# Patient Record
Sex: Male | Born: 1965 | Race: Black or African American | Hispanic: No | Marital: Single | Smoking: Never smoker
Health system: Southern US, Community
[De-identification: ages and names within clinical notes are randomized; demographics above are authoritative.]

## PROBLEM LIST (undated history)

## (undated) DIAGNOSIS — J302 Other seasonal allergic rhinitis: Secondary | ICD-10-CM

## (undated) DIAGNOSIS — K859 Acute pancreatitis without necrosis or infection, unspecified: Secondary | ICD-10-CM

## (undated) DIAGNOSIS — E1110 Type 2 diabetes mellitus with ketoacidosis without coma: Secondary | ICD-10-CM

## (undated) HISTORY — PX: NO PAST SURGERIES: SHX2092

---

## 2012-03-26 ENCOUNTER — Emergency Department (HOSPITAL_COMMUNITY)
Admission: EM | Admit: 2012-03-26 | Discharge: 2012-03-27 | Disposition: A | Discharge status: Home / Self Care | Payer: Self-pay | Attending: Emergency Medicine | Admitting: Emergency Medicine

## 2012-03-26 DIAGNOSIS — Z591 Inadequate housing, unspecified: Secondary | ICD-10-CM | POA: Insufficient documentation

## 2012-03-26 DIAGNOSIS — F1010 Alcohol abuse, uncomplicated: Secondary | ICD-10-CM

## 2012-03-26 NOTE — ED Notes (Signed)
Pt was sent from by [employer] via [facility] due to intoxication. Pt is alert and orientated. Pt has unsteady gait.VSS

## 2012-03-26 NOTE — ED Provider Notes (Signed)
History     CSN: [id number]  Arrival date & time 03/26/12  2210   First MD Initiated Contact with Patient 03/26/12 2304      Chief Complaint  Patient presents with  . Alcohol Intoxication    (Consider location/radiation/quality/duration/timing/severity/associated sxs/prior treatment) HPI HX per PT, is homeless and has been staying at a shelter.  Tonight BIB EMS 2/2 alcohol intoxication.  PT denies any pain/ injury or trauma. No medications, denies any medical problems.  PT wishes to be discharged at this time and go back to the shelter. No N/V. No F/C or recent illness.   No past medical history on file.  No past surgical history on file.  No family history on file.  History  Substance Use Topics  . Smoking status: Not on file  . Smokeless tobacco: Not on file  . Alcohol Use: Not on file      Review of Systems  Constitutional: Negative for fever and chills.  HENT: Negative for neck pain and neck stiffness.   Eyes: Negative for pain.  Respiratory: Negative for shortness of breath.   Cardiovascular: Negative for chest pain.  Gastrointestinal: Negative for abdominal pain.  Genitourinary: Negative for dysuria.  Musculoskeletal: Negative for back pain.  Skin: Negative for rash.  Neurological: Negative for headaches.  All other systems reviewed and are negative.    Allergies  Review of patient's allergies indicates no known allergies.  Home Medications  No current outpatient prescriptions on file.  BP 119/76  Pulse 88  Temp 98.1 F (36.7 C) (Oral)  Resp 18  SpO2 100%  Physical Exam  Constitutional: He is oriented to person, place, and time. He appears well-developed and well-nourished.  HENT:  Head: Normocephalic and atraumatic.  Eyes: EOM are normal. Pupils are equal, round, and reactive to light.  Neck: Neck supple.  Cardiovascular: Normal rate, regular rhythm and intact distal pulses.   Pulmonary/Chest: Effort normal and breath sounds normal. No  respiratory distress.  Abdominal: Soft. Bowel sounds are normal. He exhibits no distension. There is no tenderness.  Musculoskeletal: Normal range of motion. He exhibits no edema and no tenderness.  Neurological: He is alert and oriented to person, place, and time. No cranial nerve deficit. Coordination normal.       Speech clear, conversant and appropriate  Skin: Skin is warm and dry.    ED Course  Procedures (including critical care time)  11:45 PM tolerates POs, ambulates absolutely no distress to the bathroom unassisted, requesting to be discharged, speech clear, cooperative and appropriate.  No indication for labs, observation or admit  MDM  Alcohol intoxication earlier today, now clinically sober/ appropriate, stable for discharge.   VS and nursing notes reviewed  No old records available to review       Sunnie Nielsen, MD 03/26/12 2348

## 2012-03-26 NOTE — ED Notes (Signed)
Bed:WA18<BR> Expected date:<BR> Expected time:<BR> Means of arrival:<BR> Comments:<BR> EMS

## 2012-10-17 ENCOUNTER — Encounter (HOSPITAL_BASED_OUTPATIENT_CLINIC_OR_DEPARTMENT_OTHER): Payer: Self-pay | Admitting: *Deleted

## 2012-10-17 ENCOUNTER — Inpatient Hospital Stay (HOSPITAL_BASED_OUTPATIENT_CLINIC_OR_DEPARTMENT_OTHER)
Admission: EM | Admit: 2012-10-17 | Discharge: 2012-10-20 | DRG: 637 | Disposition: A | Discharge status: Home / Self Care | Payer: Self-pay | Attending: Internal Medicine | Admitting: Internal Medicine

## 2012-10-17 ENCOUNTER — Inpatient Hospital Stay (HOSPITAL_BASED_OUTPATIENT_CLINIC_OR_DEPARTMENT_OTHER): Payer: Self-pay

## 2012-10-17 DIAGNOSIS — E860 Dehydration: Secondary | ICD-10-CM

## 2012-10-17 DIAGNOSIS — E119 Type 2 diabetes mellitus without complications: Secondary | ICD-10-CM

## 2012-10-17 DIAGNOSIS — K859 Acute pancreatitis without necrosis or infection, unspecified: Secondary | ICD-10-CM

## 2012-10-17 DIAGNOSIS — F1020 Alcohol dependence, uncomplicated: Secondary | ICD-10-CM | POA: Diagnosis present

## 2012-10-17 DIAGNOSIS — E46 Unspecified protein-calorie malnutrition: Secondary | ICD-10-CM | POA: Diagnosis present

## 2012-10-17 DIAGNOSIS — J301 Allergic rhinitis due to pollen: Secondary | ICD-10-CM | POA: Diagnosis present

## 2012-10-17 DIAGNOSIS — R112 Nausea with vomiting, unspecified: Secondary | ICD-10-CM

## 2012-10-17 DIAGNOSIS — F1021 Alcohol dependence, in remission: Secondary | ICD-10-CM

## 2012-10-17 DIAGNOSIS — F17200 Nicotine dependence, unspecified, uncomplicated: Secondary | ICD-10-CM | POA: Diagnosis present

## 2012-10-17 DIAGNOSIS — Z6830 Body mass index (BMI) 30.0-30.9, adult: Secondary | ICD-10-CM

## 2012-10-17 DIAGNOSIS — E1110 Type 2 diabetes mellitus with ketoacidosis without coma: Secondary | ICD-10-CM

## 2012-10-17 HISTORY — DX: Type 2 diabetes mellitus with ketoacidosis without coma: E11.10

## 2012-10-17 HISTORY — DX: Acute pancreatitis without necrosis or infection, unspecified: K85.90

## 2012-10-17 HISTORY — DX: Other seasonal allergic rhinitis: J30.2

## 2012-10-17 LAB — URINALYSIS, ROUTINE W REFLEX MICROSCOPIC
Bilirubin Urine: NEGATIVE
Hgb urine dipstick: NEGATIVE
Ketones, ur: >80 mg/dL — AB
Specific Gravity, Urine: 1.038 — ABNORMAL HIGH (ref 1.005–1.030)
Urobilinogen, UA: 0.2 mg/dL (ref 0.0–1.0)

## 2012-10-17 LAB — CBC
HCT: 41.9 % (ref 39.0–52.0)
HCT: 38.8 % — ABNORMAL LOW (ref 39.0–52.0)
Hemoglobin: 14.9 g/dL (ref 13.0–17.0)
Hemoglobin: 14.1 g/dL (ref 13.0–17.0)
MCH: 33 pg (ref 26.0–34.0)
MCV: 92.7 fL (ref 78.0–100.0)
RBC: 4.52 MIL/uL (ref 4.22–5.81)
RBC: 4.24 MIL/uL (ref 4.22–5.81)
WBC: 6.4 10*3/uL (ref 4.0–10.5)
WBC: 6.7 10*3/uL (ref 4.0–10.5)

## 2012-10-17 LAB — GLUCOSE, CAPILLARY
Glucose-Capillary: 143 mg/dL — ABNORMAL HIGH (ref 70–99)
Glucose-Capillary: 148 mg/dL — ABNORMAL HIGH (ref 70–99)
Glucose-Capillary: 193 mg/dL — ABNORMAL HIGH (ref 70–99)
Glucose-Capillary: 195 mg/dL — ABNORMAL HIGH (ref 70–99)

## 2012-10-17 LAB — BASIC METABOLIC PANEL
CO2: 18 mEq/L — ABNORMAL LOW (ref 19–32)
Calcium: 8.7 mg/dL (ref 8.4–10.5)
Chloride: 96 mEq/L (ref 96–112)
Creatinine, Ser: 0.7 mg/dL (ref 0.50–1.35)
GFR calc Af Amer: >90 mL/min (ref >90)
GFR calc Af Amer: >90 mL/min (ref >90)
GFR calc non Af Amer: >90 mL/min (ref >90)
Glucose, Bld: 148 mg/dL — ABNORMAL HIGH (ref 70–99)
Potassium: 4 mEq/L (ref 3.5–5.1)
Potassium: 3.6 mEq/L (ref 3.5–5.1)
Sodium: 134 mEq/L — ABNORMAL LOW (ref 135–145)
Sodium: 135 mEq/L (ref 135–145)

## 2012-10-17 LAB — COMPREHENSIVE METABOLIC PANEL
AST: 17 U/L (ref 0–37)
BUN: 10 mg/dL (ref 6–23)
CO2: 16 mEq/L — ABNORMAL LOW (ref 19–32)
Calcium: 10.3 mg/dL (ref 8.4–10.5)
Chloride: 91 mEq/L — ABNORMAL LOW (ref 96–112)
Creatinine, Ser: 1 mg/dL (ref 0.50–1.35)
GFR calc Af Amer: >90 mL/min (ref >90)
GFR calc non Af Amer: 90 mL/min — ABNORMAL LOW (ref >90)
Glucose, Bld: 300 mg/dL — ABNORMAL HIGH (ref 70–99)
Total Bilirubin: 0.5 mg/dL (ref 0.3–1.2)

## 2012-10-17 LAB — LIPASE, BLOOD: Lipase: 211 U/L — ABNORMAL HIGH (ref 11–59)

## 2012-10-17 LAB — ETHANOL: Alcohol, Ethyl (B): <11 mg/dL (ref 0–11)

## 2012-10-17 LAB — LIPID PANEL
HDL: 34 mg/dL — ABNORMAL LOW (ref >39)
LDL Cholesterol: 172 mg/dL — ABNORMAL HIGH (ref 0–99)
Total CHOL/HDL Ratio: 6.6 RATIO
Triglycerides: 90 mg/dL (ref <150)

## 2012-10-17 LAB — URINE MICROSCOPIC-ADD ON

## 2012-10-17 LAB — MRSA PCR SCREENING: MRSA by PCR: NEGATIVE

## 2012-10-17 LAB — HEMOGLOBIN A1C: Hgb A1c MFr Bld: 15.7 % — ABNORMAL HIGH (ref <5.7)

## 2012-10-17 MED ORDER — DEXTROSE 50 % IV SOLN: 25.0000 mL | INTRAVENOUS | Status: DC | PRN

## 2012-10-17 MED ORDER — SODIUM CHLORIDE 0.9 % IV SOLN
INTRAVENOUS | Status: DC
  Administered 2012-10-17: 2.5 [IU]/h via INTRAVENOUS
  Filled 2012-10-17 (×2): qty 1

## 2012-10-17 MED ORDER — ACETONE (URINE) TEST VI STRP
1.0000 | ORAL_STRIP | Status: DC | PRN
  Filled 2012-10-17: qty 1

## 2012-10-17 MED ORDER — ONDANSETRON HCL 4 MG/2ML IJ SOLN
4.0000 mg | Freq: Once | INTRAMUSCULAR | Status: AC
  Administered 2012-10-17: 4 mg via INTRAVENOUS
  Filled 2012-10-17: qty 2

## 2012-10-17 MED ORDER — SODIUM CHLORIDE 0.9 % IV SOLN: 1000.0000 mL | INTRAVENOUS | Status: DC

## 2012-10-17 MED ORDER — SODIUM CHLORIDE 0.9 % IV SOLN
INTRAVENOUS | Status: DC
  Administered 2012-10-17: 1.8 [IU]/h via INTRAVENOUS

## 2012-10-17 MED ORDER — SODIUM CHLORIDE 0.9 % IV SOLN
INTRAVENOUS | Status: AC
  Administered 2012-10-17: 19:00:00 via INTRAVENOUS

## 2012-10-17 MED ORDER — SODIUM CHLORIDE 0.9 % IV SOLN
INTRAVENOUS | Status: AC
  Filled 2012-10-17: qty 100

## 2012-10-17 MED ORDER — DEXTROSE-NACL 5-0.45 % IV SOLN: INTRAVENOUS | Status: DC

## 2012-10-17 MED ORDER — SODIUM CHLORIDE 0.9 % IV SOLN
1000.0000 mL | Freq: Once | INTRAVENOUS | Status: AC
  Administered 2012-10-17: 1000 mL via INTRAVENOUS

## 2012-10-17 MED ORDER — INSULIN REGULAR HUMAN 100 UNIT/ML IJ SOLN
INTRAMUSCULAR | Status: AC
  Filled 2012-10-17: qty 1

## 2012-10-17 MED ORDER — ENOXAPARIN SODIUM 40 MG/0.4ML ~~LOC~~ SOLN
40.0000 mg | SUBCUTANEOUS | Status: DC
  Administered 2012-10-17 – 2012-10-19 (×3): 40 mg via SUBCUTANEOUS
  Filled 2012-10-17 (×4): qty 0.4

## 2012-10-17 MED ORDER — SODIUM CHLORIDE 0.9 % IV SOLN: INTRAVENOUS | Status: DC

## 2012-10-17 MED ORDER — ONDANSETRON HCL 4 MG/2ML IJ SOLN
4.0000 mg | Freq: Four times a day (QID) | INTRAMUSCULAR | Status: DC | PRN
Start: 2012-10-17 — End: 2012-10-20

## 2012-10-17 MED ORDER — DEXTROSE-NACL 5-0.45 % IV SOLN
INTRAVENOUS | Status: DC
  Administered 2012-10-17: 16:00:00 via INTRAVENOUS

## 2012-10-17 NOTE — ED Notes (Signed)
Pt amb to room 5 with quick steady gait in nad, pt smiling and laughing, reports nausea and abd pain x Saturday.

## 2012-10-17 NOTE — ED Notes (Signed)
hospitalist has been paged again, to [phone]

## 2012-10-17 NOTE — ED Notes (Signed)
RN and MD walk to public computer station to get pt for exam. Pt amb to room smiling in nad.

## 2012-10-17 NOTE — ED Notes (Signed)
md at bedside performing bedside ultrasound, iv placement deferred.

## 2012-10-17 NOTE — ED Notes (Signed)
Insulin drip changed to 3.3 on the pump  With cbg of 226

## 2012-10-17 NOTE — Progress Notes (Signed)
   Mark Kirby, is a 47 y.o. male, DOB - May 04, 1965, UEA:[id number], who was in outpatient alcohol detox for the last 2 months presented [facility] with chief complaints of abdominal pain and the, workup there suggestive of acute pancreatitis along with metabolic acidosis caused by mild DKA, patient has no previous history of diabetes mellitus. He is hemodynamically stable on insulin drip accepted to [facility] stepped-down due to insulin drip.     Data Review   Micro Results No results found for this or any previous visit (from the past 240 hour(s)).  Radiology Reports No results found.  CBC  Recent Labs Lab 10/17/12 1320  WBC 6.4  HGB 14.9  HCT 41.9  PLT 195  MCV 92.7  MCH 33.0  MCHC 35.6  RDW 12.2    Chemistries   Recent Labs Lab 10/17/12 1320  NA 132*  K 4.3  CL 91*  CO2 16*  GLUCOSE 300*  BUN 10  CREATININE 1.00  CALCIUM 10.3  AST 17  ALT 16  ALKPHOS 78  BILITOT 0.5   ------------------------------------------------------------------------------------------------------------------ CrCl is unknown because there is no height on file for the current visit. ------------------------------------------------------------------------------------------------------------------ No results found for this basename: HGBA1C,  in the last 72 hours ------------------------------------------------------------------------------------------------------------------ No results found for this basename: CHOL, HDL, LDLCALC, TRIG, CHOLHDL, LDLDIRECT,  in the last 72 hours ------------------------------------------------------------------------------------------------------------------ No results found for this basename: TSH, T4TOTAL, FREET3, T3FREE, THYROIDAB,  in the last 72 hours ------------------------------------------------------------------------------------------------------------------ No results found for this basename: VITAMINB12, FOLATE, FERRITIN, TIBC,  IRON, RETICCTPCT,  in the last 72 hours  Coagulation profile No results found for this basename: INR, PROTIME,  in the last 168 hours  No results found for this basename: DDIMER,  in the last 72 hours  Cardiac Enzymes No results found for this basename: CK, CKMB, TROPONINI, MYOGLOBIN,  in the last 168 hours ------------------------------------------------------------------------------------------------------------------ No components found with this basename: POCBNP,

## 2012-10-17 NOTE — ED Notes (Signed)
Pt care transferred to [facility] staff. Pt is awake, alert and cooperative, denies any c/o.

## 2012-10-17 NOTE — ED Provider Notes (Signed)
  CSN: [id number]     Arrival date & time 10/17/12  1250 History     First MD Initiated Contact with Patient 10/17/12 1254     Chief Complaint  Patient presents with  . Abdominal Pain    HPI Patient reports nausea and vomiting over the past 48 hours.  He has a prior history of alcohol abuse.  He's been sober now for 75 days and currently is in a treatment facility.  He reports every time he eats or drinks she develops upper abdominal discomfort pain and vomiting.  No fevers or chills.  History of pancreatitis.  Patient is not a diabetic.  No diarrhea.  No significant abdominal discomfort at this time.  Symptoms are mild to moderate in severity.  Nothing worsens or improves his symptoms.   History reviewed. No pertinent past medical history. History reviewed. No pertinent past surgical history. History reviewed. No pertinent family history. History  Substance Use Topics  . Smoking status: Never Smoker   . Smokeless tobacco: Not on file  . Alcohol Use: No     Comment: hx     Review of Systems  All other systems reviewed and are negative.    Allergies  Review of patient's allergies indicates no known allergies.  Home Medications  No current outpatient prescriptions on file. There were no vitals taken for this visit. Physical Exam  Nursing note and vitals reviewed. Constitutional: He is oriented to person, place, and time. He appears well-developed and well-nourished.  HENT:  Head: Normocephalic and atraumatic.  Eyes: EOM are normal.  Neck: Normal range of motion.  Cardiovascular: Normal rate, regular rhythm, normal heart sounds and intact distal pulses.   Pulmonary/Chest: Effort normal and breath sounds normal. No respiratory distress.  Abdominal: Soft. He exhibits no distension.  Mild epigastric tenderness without guarding or rebound  Genitourinary: Rectum normal.  Musculoskeletal: Normal range of motion.  Neurological: He is alert and oriented to person, place, and time.   Skin: Skin is warm and dry.  Psychiatric: He has a normal mood and affect. Judgment normal.    ED Course   Procedures (including critical care time)  Labs Reviewed  COMPREHENSIVE METABOLIC PANEL - Abnormal; Notable for the following:    Sodium 132 (*)    Chloride 91 (*)    CO2 16 (*)    Glucose, Bld 300 (*)    GFR calc non Af Amer 90 (*)    All other components within normal limits  LIPASE, BLOOD - Abnormal; Notable for the following:    Lipase 211 (*)    All other components within normal limits  CBC  HEMOGLOBIN A1C  URINALYSIS, ROUTINE W REFLEX MICROSCOPIC   No results found. 1. Pancreatitis   2. DKA (diabetic ketoacidoses)     MDM  Lipase is elevated to 11.  This likely represents pancreatitis.  Patient's fasting blood sugars 300 and his bicarbonate is 16.  This calculates out 10 anion gap 25.  This likely represents developing diabetic ketoacidosis which may also be the cause of his nausea and vomiting.  Vital signs are normal.  Facial be started on insulin at this time.  Hemoglobin A1c has been added.  Patient will require admission to the hospital  Lyanne Co, MD 10/17/12 1447

## 2012-10-17 NOTE — ED Notes (Signed)
md at bedside discussing plan of care with pt. Pt denies any c/o at this time.

## 2012-10-17 NOTE — ED Notes (Signed)
Called for pt x1.  Found pt down at the computer station.

## 2012-10-17 NOTE — H&P (Signed)
History and Physical       Hospital Admission Note Date: 10/17/2012  Patient name: Mark Kirby Medical record number: [id number] Date of birth: 24-Jun-1965 Age: 47 y.o. Gender: male PCP: Provider Not In System    Chief Complaint:  nausea and vomiting with epigastric abdominal pain for last 2 days  HPI: Patient is a 47 year old male with no past medical history except alcohol abuse who has been in the [facility] alcohol detox since may 27th, has been sober since then was [facility] ED for nausea vomiting and abdominal pain for last 2 days. Patient reports that every time he ate or drank he would followup epigastric pain radiating to the back and started having vomiting. He denied any prior history of pancreatitis. Patient denied any prior history of diabetes. He did notice that he was drinking a lot of water in the last 1 month as well as increased urination but he did not thought it something out of the ordinary. He denies any family history of diabetes. In the ER patient was noted to have severe DKA with a anion gap of 25, bicarb 16, UA positive for ketones. Lipase was elevated at 211. Patient was transferred to [facility] for further workup.  Review of Systems:  Constitutional: Denies fever, chills, diaphoresis, + poor appetite and fatigue.  HEENT: Denies photophobia, eye pain, redness, hearing loss, ear pain, congestion, sore throat, rhinorrhea, sneezing, mouth sores, trouble swallowing, neck pain, neck stiffness and tinnitus.   Respiratory: Denies SOB, DOE, cough, chest tightness,  and wheezing.   Cardiovascular: Denies chest pain, palpitations and leg swelling.  Gastrointestinal: Please see history of present illness  Genitourinary: Denies dysuria, urgency, hematuria, flank pain and difficulty urinating. + frequency/ polyuria with polydipsia Musculoskeletal: Denies myalgias, back pain, joint swelling,  arthralgias and gait problem.  Skin: Denies pallor, rash and wound.  Neurological: Denies dizziness, seizures, syncope, weakness, light-headedness, numbness and headaches.  Hematological: Denies adenopathy. Easy bruising, personal or family bleeding history  Psychiatric/Behavioral: Denies suicidal ideation, mood changes, confusion, nervousness, sleep disturbance and agitation  Past Medical History: Past Medical History  Diagnosis Date  . Pancreatitis   . Seasonal allergies   . DKA (diabetic ketoacidoses) 10/17/2012   Past Surgical History  Procedure Laterality Date  . No past surgeries      Medications: Prior to Admission medications   Not on File    Allergies:  No Known Allergies  Social History:  reports that he has been smoking.  He does not have any smokeless tobacco history on file. He reports that  drinks alcohol. He reports that he does not use illicit drugs. patient reports that prior to going to the [facility] facility he used to drink upto 1/2 gallon of vodka in the last 5 years but he has been completely sober since may 27th.  Family History: History reviewed. No pertinent family history.  Physical Exam: Blood pressure 129/70, pulse 80, temperature 98 F (36.7 C), temperature source Oral, resp. rate 14, height 5\' 7"  (1.702 m), weight 85.911 kg (189 lb 6.4 oz), SpO2 100.00%. General: Alert, awake, oriented x3, in no acute distress. HEENT: normocephalic, atraumatic, anicteric sclera, pink conjunctiva, pupils equal and reactive to light and accomodation, oropharynx clear, dry mucosal membranes  Neck: supple, no masses or lymphadenopathy, no goiter, no bruits  Heart: Regular rate and rhythm, without murmurs, rubs or gallops. Lungs: Clear to auscultation bilaterally, no wheezing, rales or rhonchi. Abdomen: Soft, very mild tenderness in the epigastric region,  nondistended, positive bowel sounds, no masses. Extremities: No clubbing, cyanosis or edema with positive pedal  pulses. Neuro: Grossly intact, no focal neurological deficits, strength 5/5 upper and lower extremities bilaterally Psych: alert and oriented x 3, normal mood and affect Skin: no rashes or lesions, warm and dry   LABS on Admission:  Basic Metabolic Panel:  Recent Labs Lab 10/17/12 1320  NA 132*  K 4.3  CL 91*  CO2 16*  GLUCOSE 300*  BUN 10  CREATININE 1.00  CALCIUM 10.3   Liver Function Tests:  Recent Labs Lab 10/17/12 1320  AST 17  ALT 16  ALKPHOS 78  BILITOT 0.5  PROT 7.5  ALBUMIN 3.9    Recent Labs Lab 10/17/12 1320  LIPASE 211*   No results found for this basename: AMMONIA,  in the last 168 hours CBC:  Recent Labs Lab 10/17/12 1320  WBC 6.4  HGB 14.9  HCT 41.9  MCV 92.7  PLT 195   Cardiac Enzymes: No results found for this basename: CKTOTAL, CKMB, CKMBINDEX, TROPONINI,  in the last 168 hours BNP: No components found with this basename: POCBNP,  CBG:  Recent Labs Lab 10/17/12 1646 10/17/12 1800  GLUCAP 226* 193*     Radiological Exams on Admission: Dg Chest 2 View  10/17/2012   *RADIOLOGY REPORT*  Clinical Data: 47 year old male with shortness of breath  CHEST - 2 VIEW  Comparison: None  Findings: The cardiomediastinal silhouette is unremarkable. The lungs are clear. There is no evidence of focal airspace disease, pulmonary edema, suspicious pulmonary nodule/mass, pleural effusion, or pneumothorax. No acute bony abnormalities are identified.  IMPRESSION: No evidence of active cardiopulmonary disease.   Original Report Authenticated By: Harmon Pier, M.D.    Assessment/Plan Principal Problem:   DKA (diabetic ketoacidoses) with new onset diabetes mellitus - Will continue DKA protocol, insulin drip, aggressive IV fluid hydration.  - Once gap is closed, HCO3 >20, BG <180 consistently and patient is improving overall, will transitioned to subcutaneous insulin and clear liquid - He will need aggressive diabetic teaching, diabetic coordinator  consultation. Obtain hemoglobin A1c.  Active Problems:   Dehydration: Continue aggressive IV fluid hydration    Pancreatitis, acute: Patient reports that he has been sober since may 27th. Lipase is 211 with normal LFTs - Will obtain abdominal ultrasound for further workup, lipid panel to rule out hypertriglyceridemia     Nausea with vomiting - Placed on Zofran as needed  DVT prophylaxis: Lovenox  CODE STATUS: Full CODE STATUS  Further plan will depend as patient's clinical course evolves and further radiologic and laboratory data become available.   Time Spent on Admission: 1 hour  RAI,RIPUDEEP M.D. [facility] 10/17/2012, 6:31 PM Pager: [phone]  If 7PM-7AM, please contact night-coverage [email or web address] Password [email or web address]

## 2012-10-17 NOTE — ED Notes (Signed)
Report to Charlyne Quale RN, via phone. ETA 15 - .

## 2012-10-17 NOTE — ED Notes (Signed)
Hospitalist has been repaged 

## 2012-10-17 NOTE — ED Notes (Signed)
CBG 239.  Amy Burns RN notified.

## 2012-10-17 NOTE — ED Notes (Signed)
[facility] has been notified of 2C18C room at [facility]

## 2012-10-18 ENCOUNTER — Inpatient Hospital Stay (HOSPITAL_COMMUNITY): Payer: Self-pay

## 2012-10-18 DIAGNOSIS — F1020 Alcohol dependence, uncomplicated: Secondary | ICD-10-CM

## 2012-10-18 DIAGNOSIS — E119 Type 2 diabetes mellitus without complications: Secondary | ICD-10-CM

## 2012-10-18 LAB — BASIC METABOLIC PANEL
BUN: 9 mg/dL (ref 6–23)
BUN: 9 mg/dL (ref 6–23)
Calcium: 9 mg/dL (ref 8.4–10.5)
Calcium: 9.2 mg/dL (ref 8.4–10.5)
Chloride: 102 mEq/L (ref 96–112)
Chloride: 101 mEq/L (ref 96–112)
Creatinine, Ser: 0.73 mg/dL (ref 0.50–1.35)
GFR calc Af Amer: >90 mL/min (ref >90)
GFR calc Af Amer: >90 mL/min (ref >90)
GFR calc Af Amer: >90 mL/min (ref >90)
GFR calc Af Amer: >90 mL/min (ref >90)
GFR calc non Af Amer: >90 mL/min (ref >90)
GFR calc non Af Amer: >90 mL/min (ref >90)
Glucose, Bld: 222 mg/dL — ABNORMAL HIGH (ref 70–99)
Potassium: 3.6 mEq/L (ref 3.5–5.1)
Potassium: 3.6 mEq/L (ref 3.5–5.1)
Sodium: 135 mEq/L (ref 135–145)
Sodium: 133 mEq/L — ABNORMAL LOW (ref 135–145)
Sodium: 132 mEq/L — ABNORMAL LOW (ref 135–145)

## 2012-10-18 LAB — GLUCOSE, CAPILLARY
Glucose-Capillary: 129 mg/dL — ABNORMAL HIGH (ref 70–99)
Glucose-Capillary: 202 mg/dL — ABNORMAL HIGH (ref 70–99)
Glucose-Capillary: 205 mg/dL — ABNORMAL HIGH (ref 70–99)
Glucose-Capillary: 247 mg/dL — ABNORMAL HIGH (ref 70–99)

## 2012-10-18 LAB — LIPASE, BLOOD: Lipase: 94 U/L — ABNORMAL HIGH (ref 11–59)

## 2012-10-18 LAB — HEMOGLOBIN A1C: Mean Plasma Glucose: 404 mg/dL — ABNORMAL HIGH (ref <117)

## 2012-10-18 MED ORDER — INSULIN ASPART 100 UNIT/ML ~~LOC~~ SOLN
0.0000 [IU] | Freq: Every day | SUBCUTANEOUS | Status: DC
  Administered 2012-10-18: 2 [IU] via SUBCUTANEOUS

## 2012-10-18 MED ORDER — INSULIN ASPART 100 UNIT/ML ~~LOC~~ SOLN
0.0000 [IU] | Freq: Three times a day (TID) | SUBCUTANEOUS | Status: DC
  Administered 2012-10-18 (×2): 5 [IU] via SUBCUTANEOUS

## 2012-10-18 MED ORDER — INSULIN ASPART PROT & ASPART (70-30 MIX) 100 UNIT/ML ~~LOC~~ SUSP
15.0000 [IU] | Freq: Two times a day (BID) | SUBCUTANEOUS | Status: DC
  Administered 2012-10-18: 15 [IU] via SUBCUTANEOUS
  Filled 2012-10-18: qty 10

## 2012-10-18 MED ORDER — INSULIN ASPART 100 UNIT/ML ~~LOC~~ SOLN
0.0000 [IU] | Freq: Three times a day (TID) | SUBCUTANEOUS | Status: DC
  Administered 2012-10-18: 3 [IU] via SUBCUTANEOUS
  Administered 2012-10-19: 2 [IU] via SUBCUTANEOUS
  Administered 2012-10-19 – 2012-10-20 (×3): 3 [IU] via SUBCUTANEOUS
  Administered 2012-10-20: 2 [IU] via SUBCUTANEOUS

## 2012-10-18 MED ORDER — INSULIN GLARGINE 100 UNIT/ML ~~LOC~~ SOLN
10.0000 [IU] | Freq: Every day | SUBCUTANEOUS | Status: DC
  Administered 2012-10-18: 10 [IU] via SUBCUTANEOUS
  Filled 2012-10-18: qty 0.1

## 2012-10-18 MED ORDER — BD GETTING STARTED TAKE HOME KIT: 3/10ML X 30G SYRINGES
1.0000 | Freq: Once | Status: AC
  Administered 2012-10-18: 1
  Filled 2012-10-18: qty 1

## 2012-10-18 MED ORDER — LIVING WELL WITH DIABETES BOOK
Freq: Once | Status: AC
  Administered 2012-10-18: 17:00:00
  Filled 2012-10-18: qty 1

## 2012-10-18 NOTE — Progress Notes (Signed)
Inpatient Diabetes Program Recommendations  AACE/ADA: New Consensus Statement on Inpatient Glycemic Control (2013)  Target Ranges:  Prepandial:   less than 140 mg/dL      Peak postprandial:   less than 180 mg/dL (1-2 hours)      Critically ill patients:  140 - 180 mg/dL   Reason for Visit: Consult for new-onset diabetes   Note:  Patient admitted in DKA with new-onset diabetes.  Note Anion GAP of 16 based on 0800 lab.  (At midnight GAP was 11 and at 0400 GAP was 14.)  Note plan to recheck BMET.  Receptive to my visit.  States not surprised at diagnosis given elevated glucose when he had some dental work done as noted by NP.  Instructed patient regarding accessing the [facility] Education Network so he can begin watching diabetes videos.  Note dietitian consult.    Note patient has no insurance at present.  Gave him information regarding purchasing of a generic glucose meter at [facility] (or any other large chain pharmacy).  Gave him information regarding generic insulins available at [facility] for $24.88 a vial  (Novolin/ReliOn Human Insulin N, R, or 70/30).    Note Case Management referral-- patient has no PCP.  Note Dietitian consult.   Nursing Staff to instruct patient regarding basic, survival skill diabetes education including CBG testing and insulin preparation & administration.  Patient expresses confidence that he can learn and ready to start instruction.  Note patient is currently on Lantus 10 units daily.  Lantus insulin will likely be cost prohibitive for him.  Based on weight, patient would likely need a higher dose.  Would recommend patient be changed to 70/30 insulin 15 units BID with breakfast and supper (0.2 units/kg) with upward titration as indicated. Needs continued B-MET monitoring until [facility] remains closed.     Thank you for the consult.  Aztlan Coll S. Elsie Lincoln, RN, CNS, CDE Inpatient Diabetes Program, team pager [phone]

## 2012-10-18 NOTE — Plan of Care (Signed)
Problem: Food- and Nutrition-Related Knowledge Deficit (NB-1.1) Goal: Nutrition education Formal process to instruct or train a patient/client in a skill or to impart knowledge to help patients/clients voluntarily manage or modify food choices and eating behavior to maintain or improve health. Outcome: Completed/Met Date Met:  10/18/12  RD consulted for nutrition education regarding diabetes.     Lab Results  Component Value Date    HGBA1C 15.7* 10/17/2012    RD provided "Carbohydrate Counting for People with Diabetes" handout from the Academy of Nutrition and Dietetics. Discussed different food groups and their effects on blood sugar, emphasizing carbohydrate-containing foods. Provided list of carbohydrates and recommended serving sizes of common foods.  Discussed importance of controlled and consistent carbohydrate intake throughout the day. Provided examples of ways to balance meals/snacks and encouraged intake of high-fiber, whole grain complex carbohydrates. Teach back method used.  Expect good compliance.  Body mass index is 29.66 kg/(m^2). Pt meets criteria for Overweight based on current BMI.  Current diet order is Carbohydrate Modified Medium Calorie.  Reports a good appetite.  Labs and medications reviewed.   No further nutrition interventions warranted at this time. If additional nutrition issues arise, please re-consult RD.  Maureen Chatters, RD, LDN Pager #: [phone] After-Hours Pager #: [phone]

## 2012-10-18 NOTE — Progress Notes (Signed)
Utilization Review Completed.  

## 2012-10-18 NOTE — Progress Notes (Signed)
[facility] Progress Note [place] TEAM 1 - Stepdown ICU Team   Mark Kirby YNW:[id number] DOB: Oct 26, 1965 DOA: 10/17/2012 PCP: Provider Not In System  Brief narrative: 47 year old male patient with unremarkable past medical history except for alcohol abuse. [facility] has [facility] and has been sober since that time. He was sent to med [facility] ED for complaints of nausea vomiting and abdominal pain for 2 days. The patient reported every time he attempts to eat or drink he would develop epigastric pain radiating to his back and began to vomit. He denied any formal history of prior pancreatitis. Has never been diagnosed with diabetes. Did notice he had been drinking quite a bit of water over the past 30 days with increased radiation. Upon presentation to the emergency department the patient's serum glucose was greater than or 100 with an anion gap of 25. Lipase was also mildly elevated at 211. He was subsequently transferred to [facility] for further workup and likely admission.  Assessment/Plan: Active Problems:   DKA (diabetic ketoacidoses)/Diabetes mellitus, new onset -pt overnight was transitioned to long acting insulin but noted still had elevated AG -will continue LA insulin (70/30 with sensitive SSI) -repeat BMET today and in am -begin diabetes education including instruction in insulin administration use of glucometer, nutrition education -diabetes coordinator following -since ? mild chronic pancreatitis will check C-peptide IgG re: pancreatic function -HgbA1c elevated at 15.7- [t recalls Dec 2013 prior to dental procedure was told suger high and was instructed to follow up with MD but never did    Dehydration -BP stable and tol clears so decrease IVF rate    Pancreatitis, acute -no formal dx in past but pt able to recall prior LUQ pain and associated GI sx's (N/V) -Lipase rapidly decreased to 90 so doubt  true pancreatitis but rather elevation due to persistent N/V -abd US unremarkable/pancreas poorly visulaized    Nausea with vomiting -due to DKA/resolved    Alcoholism in recovery -pt has plans to return to [facility] then transition to a longer term program in [place]   DVT prophylaxis: Lovenox Code Status: Full Family Communication: Patient Disposition Plan/Expected LOS: Remain in step down since anion gap previously elevated-consider discharge in next 24 hours if remains stable Isolation: None Nutritional Status: Acute protein calorie malnutrition related to new diagnosis of diabetes  Consultants: None  Procedures: None  Antibiotics: None  HPI/Subjective: Patient alert and states feels much better than previous- no further nausea vomiting or abdominal pain reported. Eager to begin diabetes education.  Objective: Blood pressure 119/79, pulse 82, temperature 98.5 F (36.9 C), temperature source Oral, resp. rate 20, height 5\' 7"  (1.702 m), weight 85.911 kg (189 lb 6.4 oz), SpO2 98.00%.  Intake/Output Summary (Last 24 hours) at 10/18/12 1327 Last data filed at 10/18/12 0700  Gross per 24 hour  Intake  23.67 ml  Output    600 ml  Net -576.33 ml     Exam: General: No acute respiratory distress Lungs: Clear to auscultation bilaterally without wheezes or crackles, RA Cardiovascular: Regular rate and rhythm without murmur gallop or rub normal S1 and S2, no peripheral edema or JVD Abdomen: Nontender, nondistended, soft, bowel sounds positive, no rebound, no ascites, no appreciable mass Musculoskeletal: No significant cyanosis, clubbing of bilateral lower extremities Neurological: Alert and oriented x 3, moves all extremities x 4 without focal neurological deficits, CN 2-12 intact  Scheduled Meds:  Scheduled Meds: . enoxaparin (  LOVENOX) injection  40 mg Subcutaneous Q24H  . insulin aspart  0-15 Units Subcutaneous TID WC  . insulin glargine  10 Units Subcutaneous Daily    Continuous Infusions: . sodium chloride      **Reviewed in detail by the Attending Physicia  Data Reviewed: Basic Metabolic Panel:  Recent Labs Lab 10/17/12 2130 10/17/12 2314 10/18/12 0048 10/18/12 0400 10/18/12 0824  NA 134* 135 135 133* 132*  K 3.3* 3.6 3.6 3.7 3.6  CL 98 102 102 100 98  CO2 18* 21 22 19  18*  GLUCOSE 185* 148* 126* 222* 254*  BUN 9 9 9 9 9   CREATININE 0.72 0.71 0.73 0.76 0.83  CALCIUM 8.7 8.8 8.8 9.0 9.3   Liver Function Tests:  Recent Labs Lab 10/17/12 1320  AST 17  ALT 16  ALKPHOS 78  BILITOT 0.5  PROT 7.5  ALBUMIN 3.9    Recent Labs Lab 10/17/12 1320 10/18/12 0400  LIPASE 211* 94*   No results found for this basename: AMMONIA,  in the last 168 hours CBC:  Recent Labs Lab 10/17/12 1320 10/17/12 1857  WBC 6.4 6.7  HGB 14.9 14.1  HCT 41.9 38.8*  MCV 92.7 91.5  PLT 195 200   Cardiac Enzymes: No results found for this basename: CKTOTAL, CKMB, CKMBINDEX, TROPONINI,  in the last 168 hours BNP (last 3 results) No results found for this basename: PROBNP,  in the last 8760 hours CBG:  Recent Labs Lab 10/18/12 0148 10/18/12 0255 10/18/12 0413 10/18/12 0819 10/18/12 1135  GLUCAP 125* 129* 146* 230* 202*    Recent Results (from the past 240 hour(s))  MRSA PCR SCREENING     Status: None   Collection Time    10/17/12  6:04 PM      Result Value Range Status   MRSA by PCR NEGATIVE  NEGATIVE Final   Comment:            The GeneXpert MRSA Assay (FDA     approved for NASAL specimens     only), is one component of a     comprehensive MRSA colonization     surveillance program. It is not     intended to diagnose MRSA     infection nor to guide or     monitor treatment for     MRSA infections.     Studies:  Recent x-ray studies have been reviewed in detail by the Attending Physician    Junious Silk, ANP [facility] Office  [phone] Pager [phone]  **If unable to reach the above provider after  paging please contact the Flow Manager @ [phone]  On-Call/Text Page:      [email or web address]      password [email or web address]  If 7PM-7AM, please contact night-coverage [email or web address] Password [email or web address] 10/18/2012, 1:27 PM   LOS: 1 day   I have examined the patient, reviewed the chart and modified the above note which I agree with.   Yani Lal,MD [phone] 10/18/2012, 6:20 PM

## 2012-10-18 NOTE — Progress Notes (Signed)
Pt explained and taught how to take fingerstick and administer insulin. Pt performed his in fingerstick, drew up on insulin in syringe, and administered 5 units of insulin in his abdominal area. Will continue to keep educating patient. Adria Dill RN

## 2012-10-19 LAB — BASIC METABOLIC PANEL
BUN: 9 mg/dL (ref 6–23)
BUN: 9 mg/dL (ref 6–23)
CO2: 20 mEq/L (ref 19–32)
Calcium: 9.5 mg/dL (ref 8.4–10.5)
Chloride: 102 mEq/L (ref 96–112)
Creatinine, Ser: 0.68 mg/dL (ref 0.50–1.35)
Creatinine, Ser: 0.7 mg/dL (ref 0.50–1.35)
GFR calc Af Amer: >90 mL/min (ref >90)
GFR calc non Af Amer: >90 mL/min (ref >90)
Glucose, Bld: 228 mg/dL — ABNORMAL HIGH (ref 70–99)
Potassium: 3 mEq/L — ABNORMAL LOW (ref 3.5–5.1)

## 2012-10-19 MED ORDER — INSULIN ASPART PROT & ASPART (70-30 MIX) 100 UNIT/ML ~~LOC~~ SUSP
20.0000 [IU] | Freq: Two times a day (BID) | SUBCUTANEOUS | Status: DC
  Administered 2012-10-19 – 2012-10-20 (×3): 20 [IU] via SUBCUTANEOUS
  Filled 2012-10-19: qty 10

## 2012-10-19 MED ORDER — INSULIN NPH ISOPHANE & REGULAR (70-30) 100 UNIT/ML ~~LOC~~ SUSP: 20.0000 [IU] | Freq: Two times a day (BID) | SUBCUTANEOUS | Status: DC

## 2012-10-19 MED ORDER — POTASSIUM CHLORIDE CRYS ER 20 MEQ PO TBCR
40.0000 meq | EXTENDED_RELEASE_TABLET | Freq: Once | ORAL | Status: AC
  Administered 2012-10-19: 40 meq via ORAL
  Filled 2012-10-19: qty 2

## 2012-10-19 MED ORDER — INSULIN SYRINGES (DISPOSABLE) U-100 1 ML MISC: Status: AC

## 2012-10-19 MED ORDER — GLUCOSE BLOOD VI STRP: ORAL_STRIP | Status: AC

## 2012-10-19 MED ORDER — SODIUM CHLORIDE 0.9 % IV SOLN
1000.0000 mL | INTRAVENOUS | Status: DC
  Administered 2012-10-19: 1000 mL via INTRAVENOUS

## 2012-10-19 NOTE — Progress Notes (Signed)
[facility] Progress Note  TEAM 1 - Stepdown ICU Team   Mark Kirby RUE:[id number] DOB: 1965-10-09 DOA: 10/17/2012 PCP: Provider Not In System  Brief narrative: 47 year old male patient with unremarkable past medical history except for alcohol abuse. He has recently been at the [facility] alcohol detox since May 27 and has been sober since that time. He was sent to [facility] ED for complaints of nausea vomiting and abdominal pain for 2 days. The patient reported every time he attempts to eat or drink he would develop epigastric pain radiating to his back and began to vomit. He denied any formal history of prior pancreatitis. Has never been diagnosed with diabetes. Did notice he had been drinking quite a bit of water over the past 30 days with increased urination. Upon presentation to the emergency department the patient's serum glucose was markedly elevated with an anion gap of 25. Lipase was also elevated at 211. He was subsequently transferred to [facility] for further workup and likely admission.  Assessment/Plan:    DKA (diabetic ketoacidoses)/Diabetes mellitus, new onset -pt was transitioned to long acting insulin - over past 24 hours AG trend remains down- AG 8/20 was 14 so still at risk for recurrent DKA so continue to follow BMET closely -will continue LA insulin (70/30 with sensitive SSI) - repeat BMET today and in am -begin diabetes education including instruction in insulin administration use of glucometer, nutrition education -diabetes coordinator following -HgbA1c elevated at 15.7- Pt recalls Dec 2013 prior to dental procedure was told suger high and was instructed to follow up with MD but never did -CM assisting pt to locate a PCP    Dehydration -BP stable and tol clears so decrease IVF rate    Pancreatitis, acute -no formal dx in past but pt able to recall prior LUQ pain and associated GI sx's (N/V) -Lipase rapidly decreased to  90 -abd US unremarkable/pancreas poorly visulaized    Nausea with vomiting -due to DKA/resolved    Alcoholism in recovery -pt has plans to return to [facility] then transition to a longer term program in [employer] screening -FLP abnormal but this in setting of recent mild pancreatitis so recommend repeat in 6 weeks   DVT prophylaxis: Lovenox Code Status: Full Family Communication: no family present at time of exam  Disposition Plan/Expected LOS: Remain in step down since anion gap previously elevated-consider discharge in next 24 hours if remains stable  Consultants: None  Procedures: None  Antibiotics: None  HPI/Subjective: Patient alert and verbalizes understanding as to why not ready for dc just yet. No complaints.  Objective: Blood pressure 99/62, pulse 70, temperature 98.2 F (36.8 C), temperature source Oral, resp. rate 10, height 5\' 7"  (1.702 m), weight 88.678 kg (195 lb 8 oz), SpO2 99.00%. No intake or output data in the 24 hours ending 10/19/12 1132  Exam: General: No acute respiratory distress Lungs: Clear to auscultation bilaterally without wheezes or crackles, RA Cardiovascular: Regular rate and rhythm without murmur gallop or rub normal S1 and S2, no peripheral edema Abdomen: Nontender, nondistended, soft, bowel sounds positive, no rebound, no ascites, no appreciable mass Musculoskeletal: No significant cyanosis, clubbing of bilateral lower extremities Neurological: Alert and oriented x 3, moves all extremities x 4 without focal neurological deficits, CN 2-12 intact  Scheduled Meds:  Scheduled Meds: . enoxaparin (LOVENOX) injection  40 mg Subcutaneous Q24H  . insulin aspart  0-5 Units Subcutaneous QHS  . insulin aspart  0-9 Units Subcutaneous  TID WC  . insulin aspart protamine- aspart  20 Units Subcutaneous BID WC   Data Reviewed: Basic Metabolic Panel:  Recent Labs Lab 10/18/12 0048 10/18/12 0400 10/18/12 0824 10/18/12 1529  10/19/12 0500  NA 135 133* 132* 133* 136  K 3.6 3.7 3.6 3.9 3.0*  CL 102 100 98 101 102  CO2 22 19 18* 22 20  GLUCOSE 126* 222* 254* 244* 216*  BUN 9 9 9 8 9   CREATININE 0.73 0.76 0.83 0.73 0.68  CALCIUM 8.8 9.0 9.3 9.2 9.0   Liver Function Tests:  Recent Labs Lab 10/17/12 1320  AST 17  ALT 16  ALKPHOS 78  BILITOT 0.5  PROT 7.5  ALBUMIN 3.9    Recent Labs Lab 10/17/12 1320 10/18/12 0400  LIPASE 211* 94*   CBC:  Recent Labs Lab 10/17/12 1320 10/17/12 1857  WBC 6.4 6.7  HGB 14.9 14.1  HCT 41.9 38.8*  MCV 92.7 91.5  PLT 195 200   CBG:  Recent Labs Lab 10/18/12 0413 10/18/12 0819 10/18/12 1135 10/18/12 1752 10/18/12 2038  GLUCAP 146* 230* 202* 205* 247*    Recent Results (from the past 240 hour(s))  MRSA PCR SCREENING     Status: None   Collection Time    10/17/12  6:04 PM      Result Value Range Status   MRSA by PCR NEGATIVE  NEGATIVE Final   Comment:            The GeneXpert MRSA Assay (FDA     approved for NASAL specimens     only), is one component of a     comprehensive MRSA colonization     surveillance program. It is not     intended to diagnose MRSA     infection nor to guide or     monitor treatment for     MRSA infections.     Studies:  Recent x-ray studies have been reviewed in detail by the Attending Physician  Junious Silk, ANP [facility] Office  [phone] Pager [phone]  **If unable to reach the above provider after paging please contact the Flow Manager @ [phone]  On-Call/Text Page:      [email or web address]      password [email or web address]  If 7PM-7AM, please contact night-coverage [email or web address] Password [email or web address] 10/19/2012, 11:32 AM   LOS: 2 days   I have personally examined this patient and reviewed the entire database. I have reviewed the above note, made any necessary editorial changes, and agree with its content.  Lonia Blood, MD [facility]

## 2012-10-19 NOTE — Progress Notes (Signed)
Morning insulin drawn and administered by pt  successfully , without any  difficulties.

## 2012-10-19 NOTE — Progress Notes (Signed)
Inpatient Diabetes Program Recommendations  AACE/ADA: New Consensus Statement on Inpatient Glycemic Control (2013)  Target Ranges:  Prepandial:   less than 140 mg/dL      Peak postprandial:   less than 180 mg/dL (1-2 hours)      Critically ill patients:  140 - 180 mg/dL  New onset DM Assume this patient is type 2, but may want to do some testing Agree with 20 units increase in  70/30 Will follow and assure pt is being educated regarding monitoring and insulin injections, hypo and hyper, etc. All has been ordered  Thank you, Lenor Coffin, RN, CNS, Diabetes Coordinator [phone])

## 2012-10-19 NOTE — Discharge Summary (Addendum)
Physician Discharge Summary  Mark Kirby XBJ:[id number] DOB: 01-13-1966 DOA: 10/17/2012  PCP: None prior to admit- needs to establish  Admit date: 10/17/2012 Discharge date: 10/20/2012  Time spent: 30 minutes  Recommendations for Outpatient Follow-up:  1. Return to [facility] to complete your treatment plan 2. Please keep appointment with the [facility] 3. F/u c peptide   History of present illness: 47 year old male patient with unremarkable past medical history except for alcohol abuse. He has recently been at the [facility] alcohol detox since May 27 and has been sober since that time. He was sent to [facility] ED for complaints of nausea vomiting and abdominal pain for 2 days. The patient reported every time he attempts to eat or drink he would develop epigastric pain radiating to his back and began to vomit. He denied any formal history of prior pancreatitis. Has never been diagnosed with diabetes. Did notice he had been drinking quite a bit of water over the past 30 days with increased urination. Upon presentation to the emergency department the patient's serum glucose was greater than or 100 with an anion gap of 25. Lipase was also mildly elevated at 211. He was subsequently transferred to [facility] for further workup and likely admission.   Discharge Diagnoses/Hospital Course:     DKA (diabetic ketoacidoses)/Diabetes mellitus, new onset  -was started on IV insulin and eventually switched to 70/30 with sensitive SSI with adequate glycemic control -diabetes education including instruction in insulin administration use of glucometer, nutrition education was initiated during this admission -diabetes coordinator followed and made helpful recommendations -because of presentation with mild chronic pancreatitis in an alcoholic a C-peptide level re: pancreatic function was checked-inadvertantly entered wrong test initially- correct test ordered on  date of discharge and is pending -HgbA1c was elevated at 15.7-pt recalls in Dec 2013 prior to a dental procedure he was told his sugar high and was instructed to follow up with an MD but never did so. -CM arranged an initial appointment with [facility]  Dehydration  -resolved with IVF hydration  Pancreatitis, acute  -no formal dx in past but pt able to recall prior LUQ pain and associated GI sx's (N/V)  - was not drinking recently (was at [facility]), abd US unremarkable/pancreas poorly visualized, Triglycerides not significantly elevated -Lipase rapidly decreased to 90 so doubt true pancreatitis but rather elevation due to persistent N/V    Nausea with vomiting  -was due to DKA and resolved quickly with correction of hyperglycemia  Alcoholism in recovery  -pt has plans to return to [facility] then transition to a longer term program in [employer] screening  -Fasting lipids abnormal-  recommend repeat in 2-3 months    Discharge Condition: stable  Diet recommendation: carbohydrate modified- low fat  Filed Weights   10/17/12 1756 10/19/12 0327  Weight: 85.911 kg (189 lb 6.4 oz) 88.678 kg (195 lb 8 oz)     Procedures:  None  Consultations:  Diabetes Educator  Case management  Discharge Exam: Filed Vitals:   10/20/12 0823  BP: 105/71  Pulse: 85  Temp: 98.1 F (36.7 C)  Resp: 21   General: No acute respiratory distress  Lungs: Clear to auscultation bilaterally without wheezes or crackles, RA  Cardiovascular: Regular rate and rhythm without murmur gallop or rub normal S1 and S2, no peripheral edema or JVD  Abdomen: Nontender, nondistended, soft, bowel sounds positive, no rebound, no ascites, no appreciable mass  Musculoskeletal:  No significant cyanosis, clubbing of bilateral lower extremities  Neurological: Alert and oriented x 3, moves all extremities x 4 without focal neurological deficits, CN 2-12 intact   Discharge  Instructions      Discharge Orders   Future Appointments Provider Department Dept Phone   11/14/2012 2:30 PM [facility] Covering Provider [facility] Flores [phone]   Future Orders Complete By Expires   Call MD for:  extreme fatigue  As directed    Call MD for:  persistant dizziness or light-headedness  As directed    Call MD for:  persistant nausea and vomiting  As directed    Call MD for:  temperature >100.4  As directed    Diet Carb Modified  As directed    Comments:     And low fat   Discharge instructions  As directed    Comments:     Your cholesterol level is very high- you must take a low fat diet- no oily or fried food- your cholesterol should be checked in 2-3 months after starting this diet.   For home use only DME Glucometer  As directed    Increase activity slowly  As directed        Medication List         glucose blood test strip  Use as instructed     glucose monitoring kit monitoring kit  1 each by Does not apply route as needed for other. Or any available-please ensure receives appropriate test strips and lancets     insulin NPH-regular (70-30) 100 UNIT/ML injection  Commonly known as:  NOVOLIN 70/30  Inject 20 Units into the skin daily with supper. Use with the breakfast and supper meals.     insulin NPH-regular (70-30) 100 UNIT/ML injection  Commonly known as:  NOVOLIN 70/30  Inject 24 Units into the skin daily with breakfast.     Insulin Syringes (Disposable) U-100 1 ML Misc  Use to give your prescribed insulin     multivitamin with minerals Tabs tablet  Take 1 tablet by mouth daily.       No Known Allergies Follow-up Information   Follow up with [facility]    . (appointment Monday, 11/14/12, @ 2:30 p.m.)    Contact information:   [address] [address]        The results of significant diagnostics from this hospitalization (including imaging, microbiology,  ancillary and laboratory) are listed below for reference.    Significant Diagnostic Studies: Dg Chest 2 View  10/17/2012   *RADIOLOGY REPORT*  Clinical Data: 47 year old male with shortness of breath  CHEST - 2 VIEW  Comparison: None  Findings: The cardiomediastinal silhouette is unremarkable. The lungs are clear. There is no evidence of focal airspace disease, pulmonary edema, suspicious pulmonary nodule/mass, pleural effusion, or pneumothorax. No acute bony abnormalities are identified.  IMPRESSION: No evidence of active cardiopulmonary disease.   Original Report Authenticated By: Harmon Pier, M.D.   US Abdomen Complete  10/18/2012   *RADIOLOGY REPORT*  Clinical Data:  Acute pancreatitis.  Diabetic.  COMPLETE ABDOMINAL ULTRASOUND  Comparison:  None.  Findings:  Gallbladder:  No gallstones, gallbladder wall thickening, or pericholecystic fluid.  Common bile duct:  2.9 mm  Liver:  11 mm complex cyst left lobe of the liver.  9 mm simple cyst right lobe of the liver.  IVC:  Not well visualized  Pancreas:  Not well visualized  Spleen:  4.5 cm  Right Kidney:  11.4 cm.  Negative for obstruction.  18 mm mid pole cyst  Left Kidney:  11.6 cm.  Negative for obstruction.  2.5 cm mid pole cyst.  Abdominal aorta:  No aneurysm identified.  IMPRESSION: Negative for gallstones.  Pancreas not well seen.   Original Report Authenticated By: Janeece Riggers, M.D.    Microbiology: Recent Results (from the past 240 hour(s))  MRSA PCR SCREENING     Status: None   Collection Time    10/17/12  6:04 PM      Result Value Range Status   MRSA by PCR NEGATIVE  NEGATIVE Final   Comment:            The GeneXpert MRSA Assay (FDA     approved for NASAL specimens     only), is one component of a     comprehensive MRSA colonization     surveillance program. It is not     intended to diagnose MRSA     infection nor to guide or     monitor treatment for     MRSA infections.     Labs: Basic Metabolic Panel:  Recent Labs Lab  10/18/12 0824 10/18/12 1529 10/19/12 0500 10/19/12 1411 10/20/12 0400  NA 132* 133* 136 136 135  K 3.6 3.9 3.0* 4.0 3.0*  CL 98 101 102 102 101  CO2 18* 22 20 23 22   GLUCOSE 254* 244* 216* 228* 191*  BUN 9 8 9 9 8   CREATININE 0.83 0.73 0.68 0.70 0.73  CALCIUM 9.3 9.2 9.0 9.5 9.4   Liver Function Tests:  Recent Labs Lab 10/17/12 1320  AST 17  ALT 16  ALKPHOS 78  BILITOT 0.5  PROT 7.5  ALBUMIN 3.9    Recent Labs Lab 10/17/12 1320 10/18/12 0400  LIPASE 211* 94*   No results found for this basename: AMMONIA,  in the last 168 hours CBC:  Recent Labs Lab 10/17/12 1320 10/17/12 1857  WBC 6.4 6.7  HGB 14.9 14.1  HCT 41.9 38.8*  MCV 92.7 91.5  PLT 195 200   Cardiac Enzymes: No results found for this basename: CKTOTAL, CKMB, CKMBINDEX, TROPONINI,  in the last 168 hours BNP: BNP (last 3 results) No results found for this basename: PROBNP,  in the last 8760 hours CBG:  Recent Labs Lab 10/18/12 2038 10/19/12 1115 10/19/12 1627 10/19/12 2220 10/20/12 0819  GLUCAP 247* 232* 233* 138* 154*    Lipid Panel     Component Value Date/Time   CHOL 224* 10/17/2012 1936   TRIG 90 10/17/2012 1936   HDL 34* 10/17/2012 1936   CHOLHDL 6.6 10/17/2012 1936   VLDL 18 10/17/2012 1936   LDLCALC 172* 10/17/2012 1936      Signed:  Halina Asano ANP [facility] 10/20/2012, 10:41 AM   I have examined the patient, reviewed the chart and modified the above note which I agree with.   Marwa Fuhrman,MD [phone] 10/20/2012, 10:41 AM

## 2012-10-20 LAB — BASIC METABOLIC PANEL
BUN: 8 mg/dL (ref 6–23)
Chloride: 101 mEq/L (ref 96–112)
Creatinine, Ser: 0.73 mg/dL (ref 0.50–1.35)
GFR calc Af Amer: >90 mL/min (ref >90)
Glucose, Bld: 191 mg/dL — ABNORMAL HIGH (ref 70–99)
Potassium: 3 mEq/L — ABNORMAL LOW (ref 3.5–5.1)

## 2012-10-20 LAB — GLUCOSE, CAPILLARY
Glucose-Capillary: 181 mg/dL — ABNORMAL HIGH (ref 70–99)
Glucose-Capillary: 138 mg/dL — ABNORMAL HIGH (ref 70–99)
Glucose-Capillary: 154 mg/dL — ABNORMAL HIGH (ref 70–99)
Glucose-Capillary: 238 mg/dL — ABNORMAL HIGH (ref 70–99)

## 2012-10-20 MED ORDER — FREESTYLE SYSTEM KIT: 1.0000 | PACK | Status: AC | PRN

## 2012-10-20 MED ORDER — INSULIN NPH ISOPHANE & REGULAR (70-30) 100 UNIT/ML ~~LOC~~ SUSP: 20.0000 [IU] | Freq: Every day | SUBCUTANEOUS | Status: DC

## 2012-10-20 MED ORDER — INSULIN NPH ISOPHANE & REGULAR (70-30) 100 UNIT/ML ~~LOC~~ SUSP: 24.0000 [IU] | Freq: Every day | SUBCUTANEOUS | Status: DC

## 2012-10-20 MED ORDER — POTASSIUM CHLORIDE 10 MEQ/100ML IV SOLN
10.0000 meq | INTRAVENOUS | Status: AC
  Filled 2012-10-20: qty 300

## 2012-10-20 MED ORDER — POTASSIUM CHLORIDE CRYS ER 20 MEQ PO TBCR
40.0000 meq | EXTENDED_RELEASE_TABLET | Freq: Once | ORAL | Status: AC
  Administered 2012-10-20: 40 meq via ORAL
  Filled 2012-10-20 (×2): qty 1

## 2012-10-20 NOTE — Progress Notes (Signed)
Patient will be discharge to home.  Discharge instructions and prescriptions for insulin, glucometer and gluco sticks given.  Patient verbalized understanding.  Awaiting for his ride.

## 2012-10-20 NOTE — Care Management Note (Signed)
    Page 1 of 1   10/20/2012     11:26:17 AM   CARE MANAGEMENT NOTE 10/20/2012  Patient:  Mark Kirby, Mark Kirby   Account Number:  [id number]  Date Initiated:  10/19/2012  Documentation initiated by:  Alvira Philips Assessment:   47 yr-old male adm with DKA; lives with friend, PTA was in [facility] rehab     DC Planning Services  CM consult  [facility] Program  [facility]igent [facility] Clinic  Medication Assistance      Status of service:  Completed, signed off  Discharge Disposition:  HOME/SELF CARE  Per UR Regulation:  Reviewed for med. necessity/level of care/duration of stay  Pt has appt @ [facility] for Monday, 11/14/12, @ 2:30 p.m. Provided contact information for Clinic and letter for [facility] program, print-out and information about [employer] glucose monitor and strips.  Also provided pharmaceutical company patient assistance program information.  10/19/12 7829 Verdis Prime RN MSN BSN CCM Pt does not have PCP, requests f/u appt with [facility].  Pt also states he will be unable to afford meds - will qualify for [facility] program.

## 2012-10-20 NOTE — Progress Notes (Signed)
Discharge to home.  Patient prefers to walk out of the hospital to meet his ride.  Case Manager clarified the prescription for the patient.  Patient  Can go to [facility] outpatient pharmacy to fill his prescription.

## 2012-11-14 ENCOUNTER — Ambulatory Visit: Payer: Self-pay

## 2014-07-18 IMAGING — CR DG CHEST 2V
2 series · 2 of 2 positions shown · non-contrast
Comparison: None

CLINICAL DATA: 46-year-old male with shortness of breath

CHEST - 2 VIEW

[w chest pa]
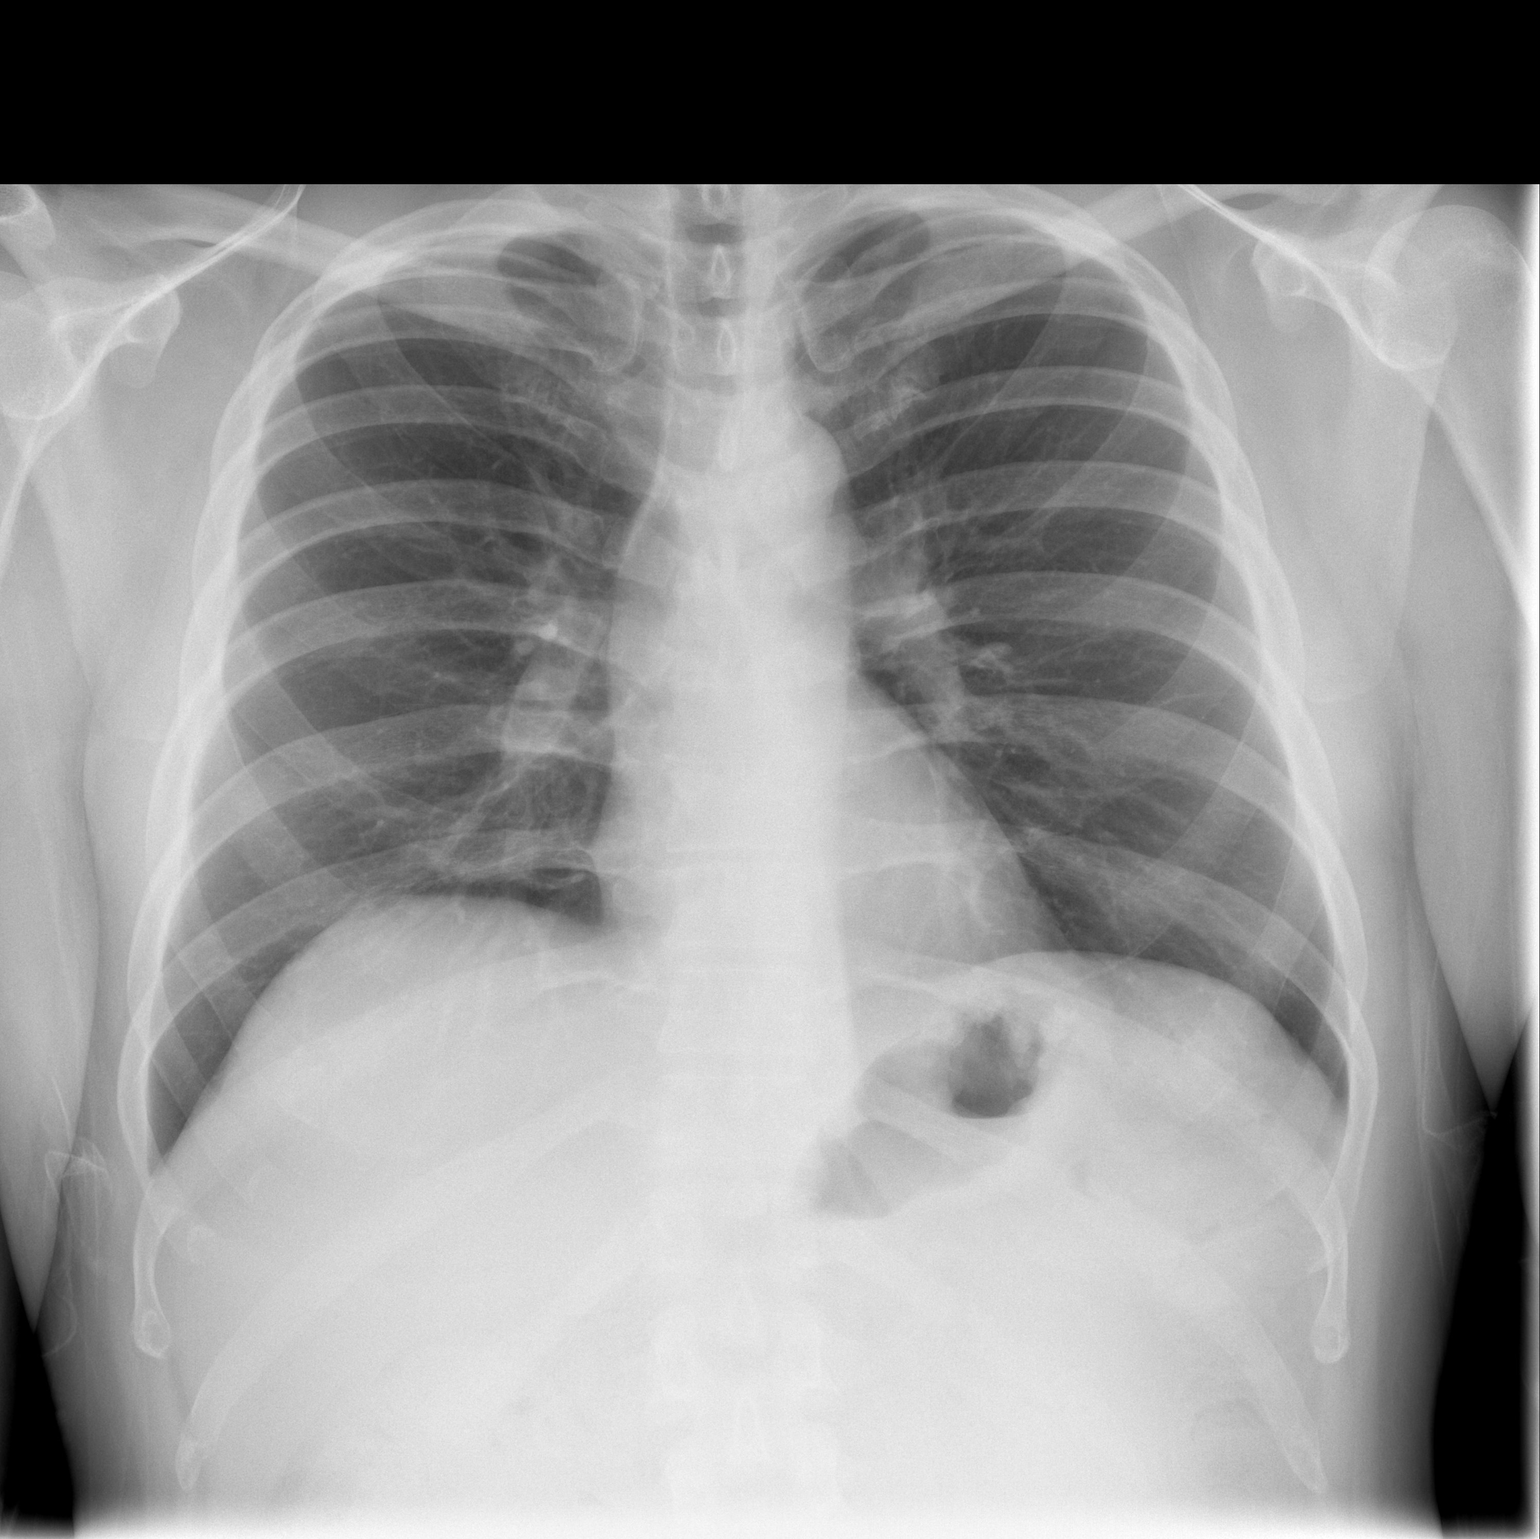

[w chest lat]
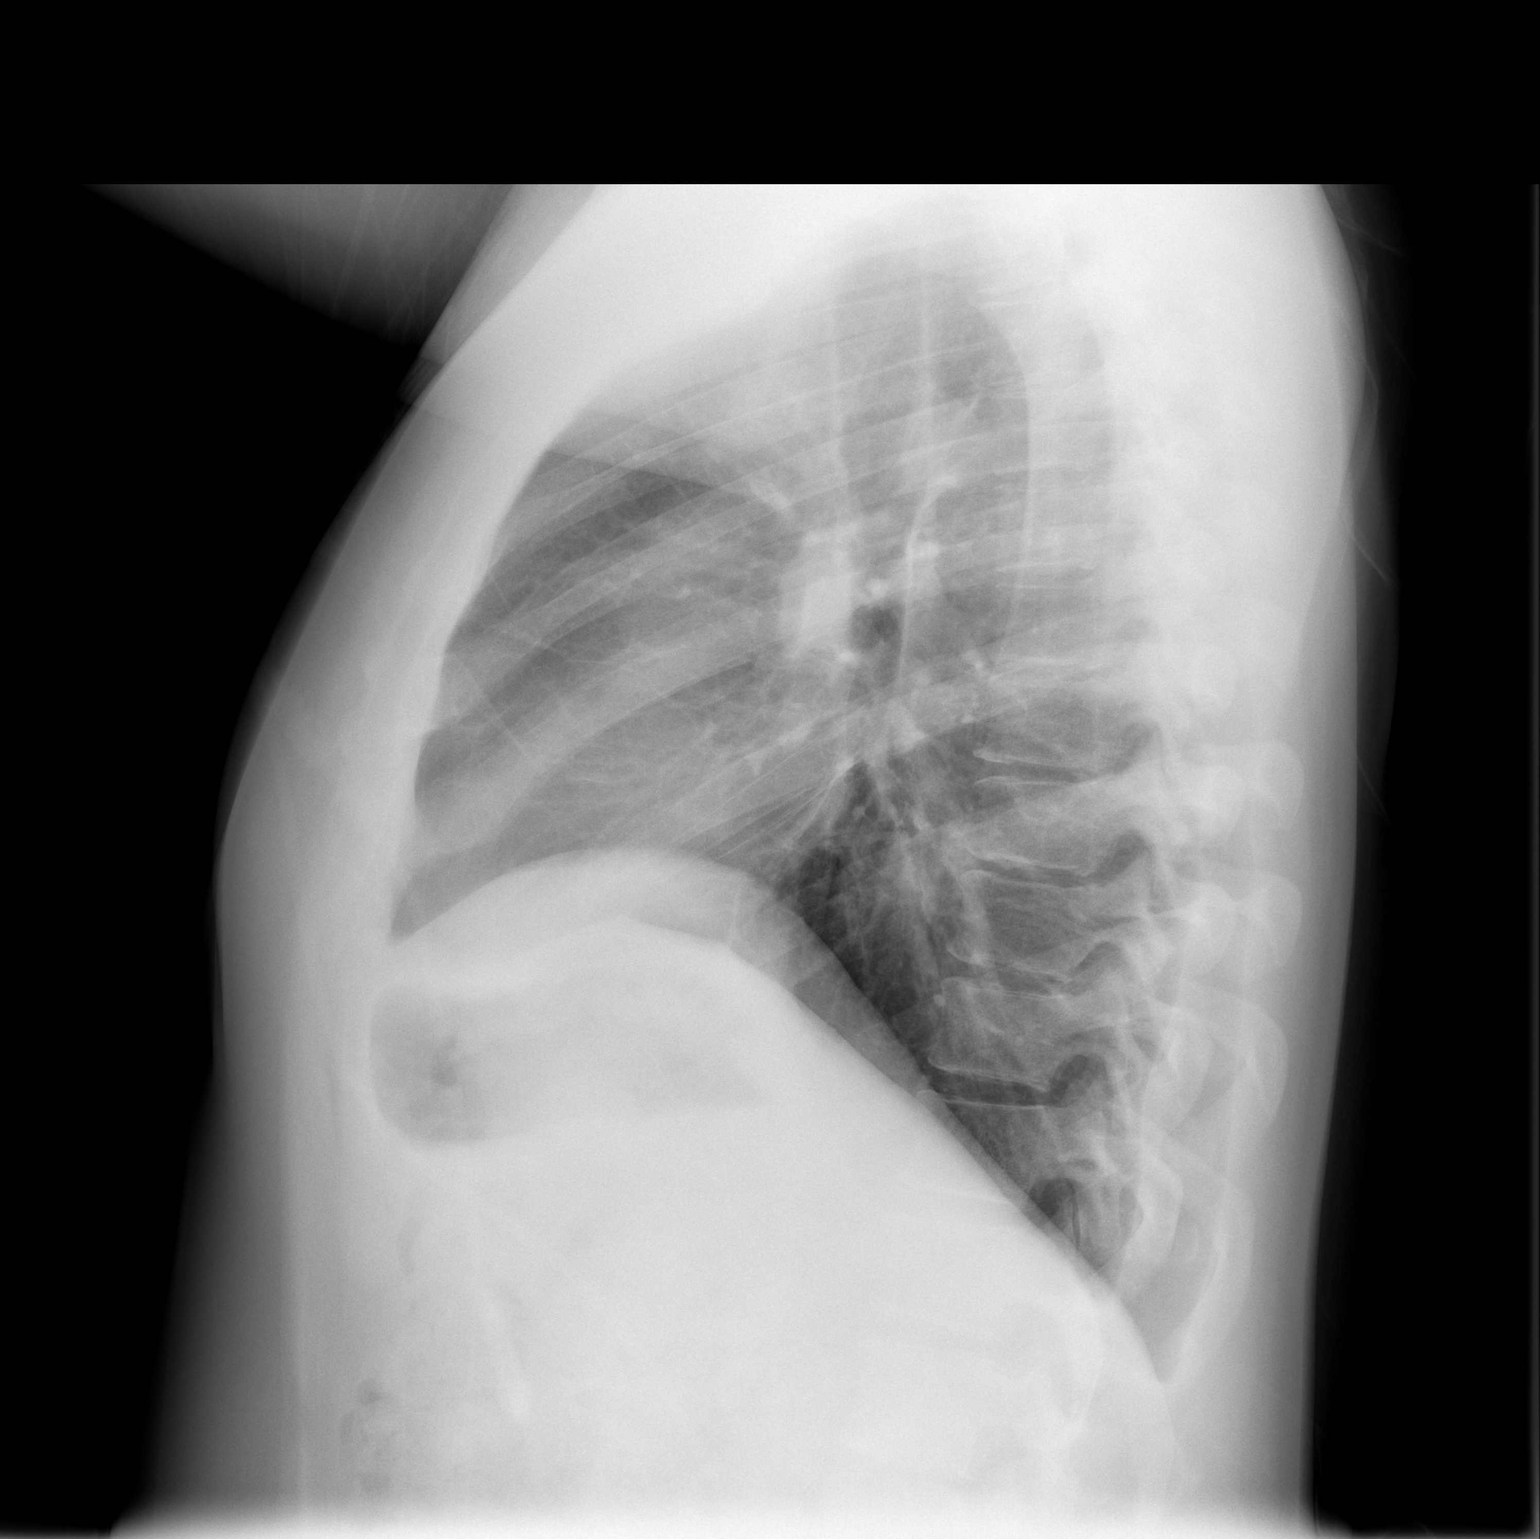

[2 of 2 positions shown; findings below may reference images not displayed]

FINDINGS: The cardiomediastinal silhouette is unremarkable.
The lungs are clear.
There is no evidence of focal airspace disease, pulmonary edema,
suspicious pulmonary nodule/mass, pleural effusion, or
pneumothorax.
No acute bony abnormalities are identified.
IMPRESSION: No evidence of active cardiopulmonary disease.

## 2014-07-19 IMAGING — US US ABDOMEN COMPLETE
1 series · 14 of 25 positions shown · non-contrast
Comparison: None.

CLINICAL DATA: Acute pancreatitis.  Diabetic.

COMPLETE ABDOMINAL ULTRASOUND

[Series 1: us abdomen complete · 0.26mm/px · 14 of 82 slices shown]
[im 1/82]
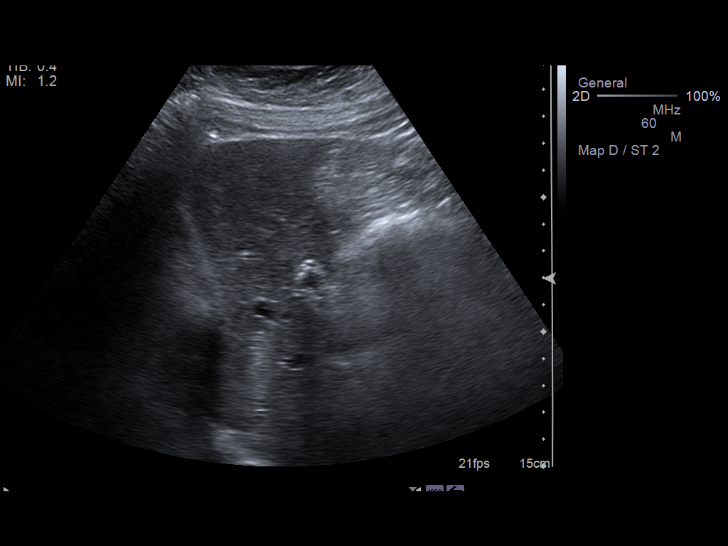
[im 7/82]
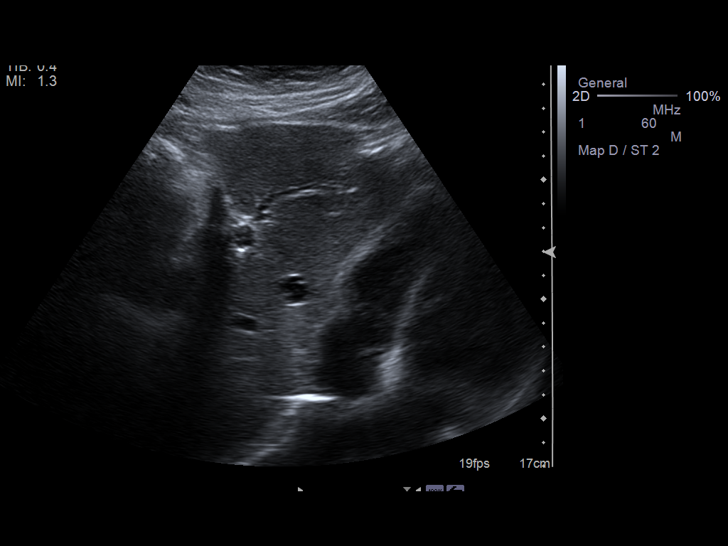
[im 14/82]
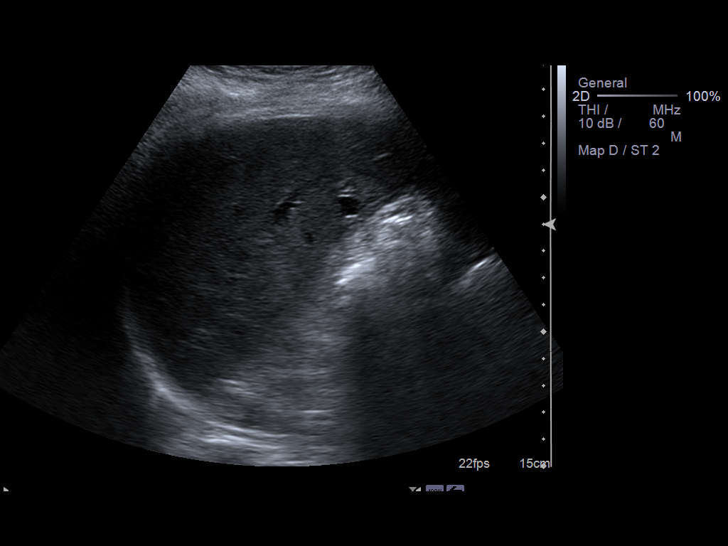
[im 21/82]
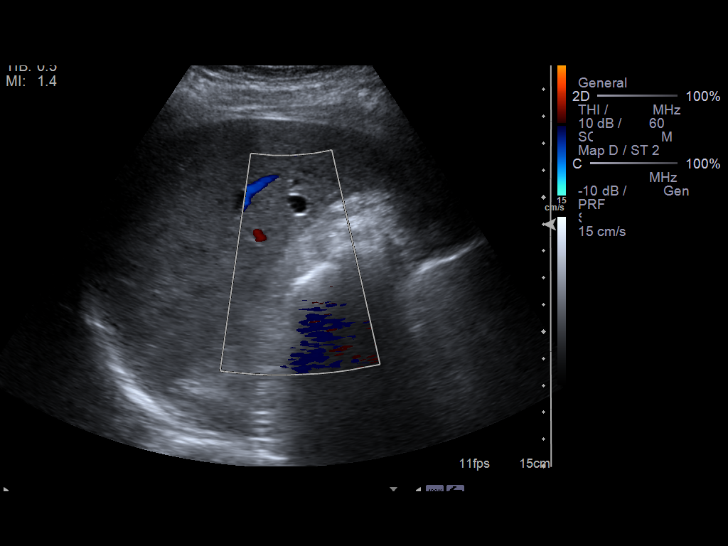
[im 28/82]
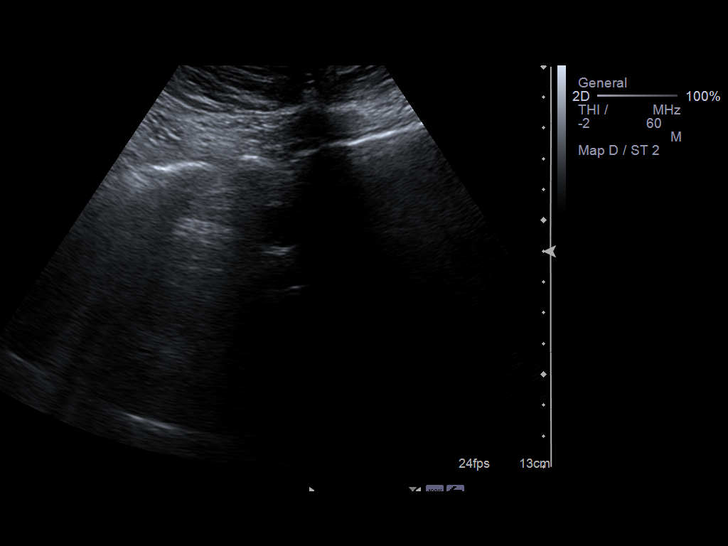
[im 31/82]
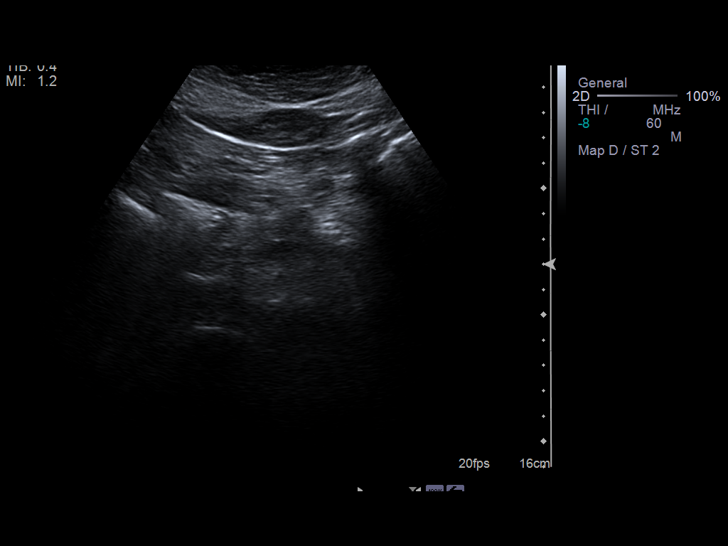
[im 38/82]
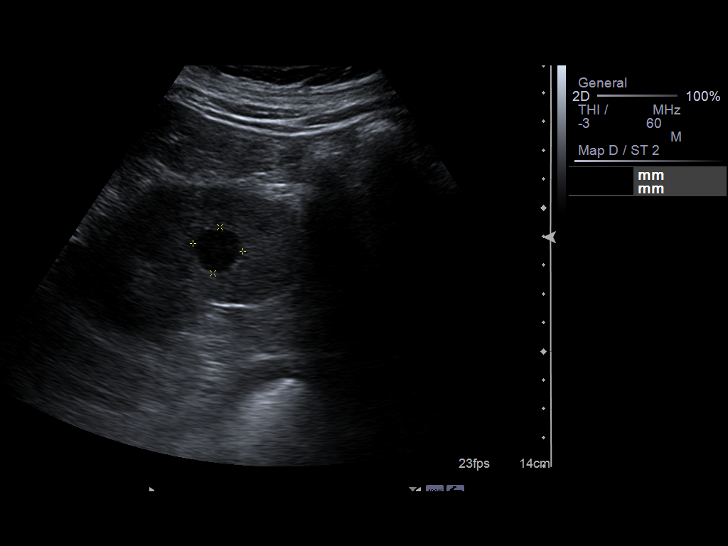
[im 44/82]
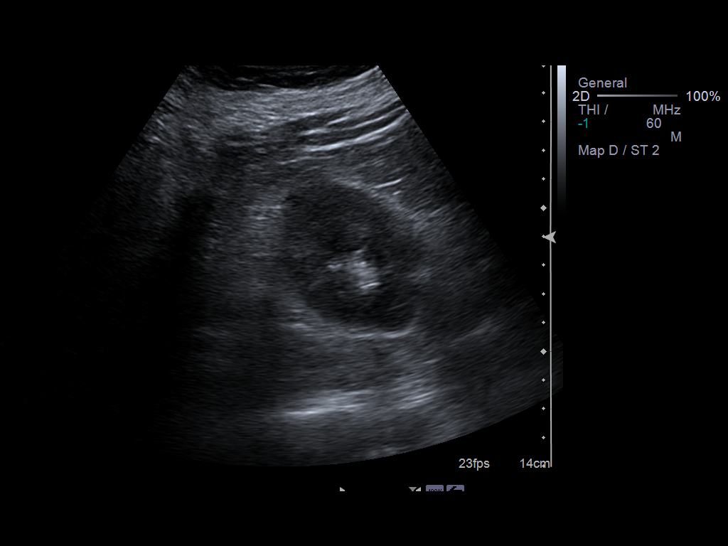
[im 51/82]
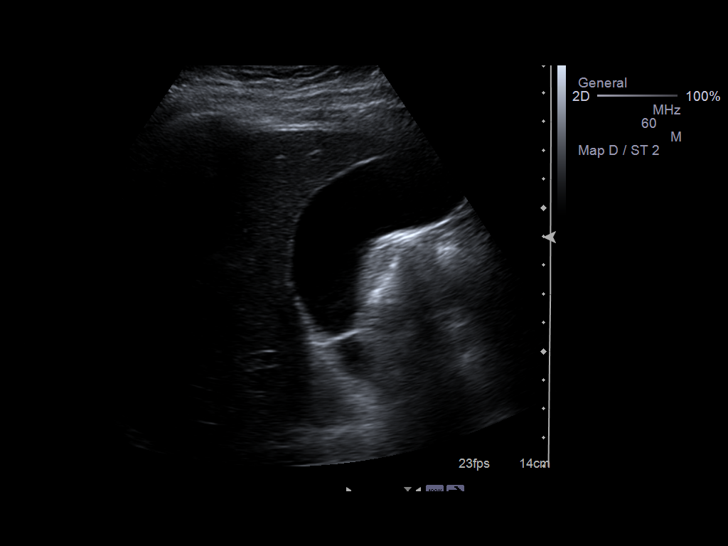
[im 55/82]
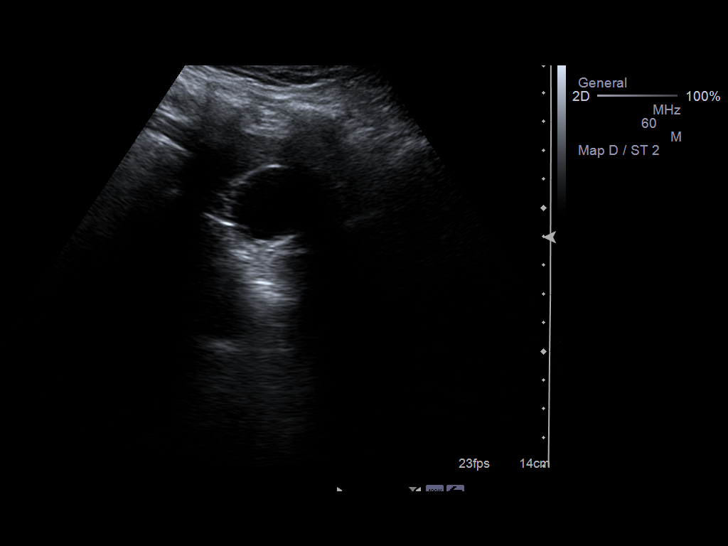
[im 61/82]
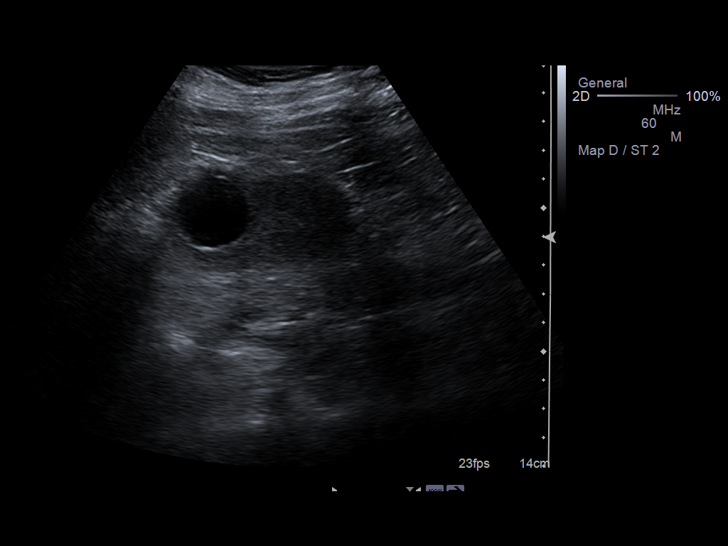
[im 68/82]
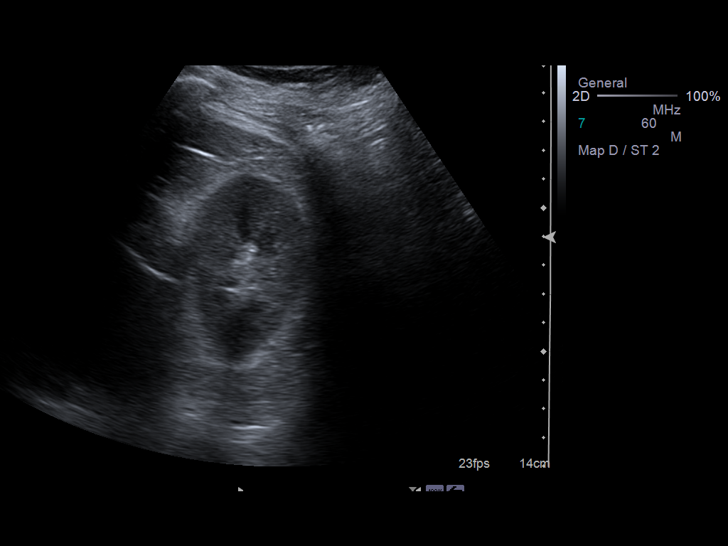
[im 75/82]
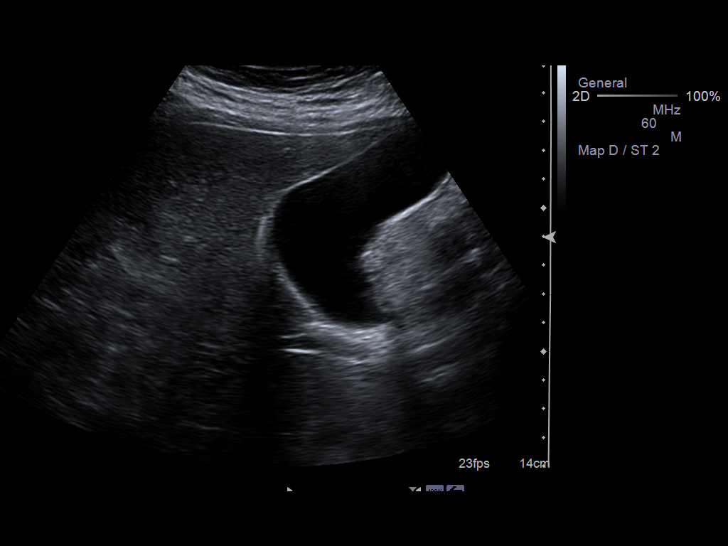
[im 82/82]
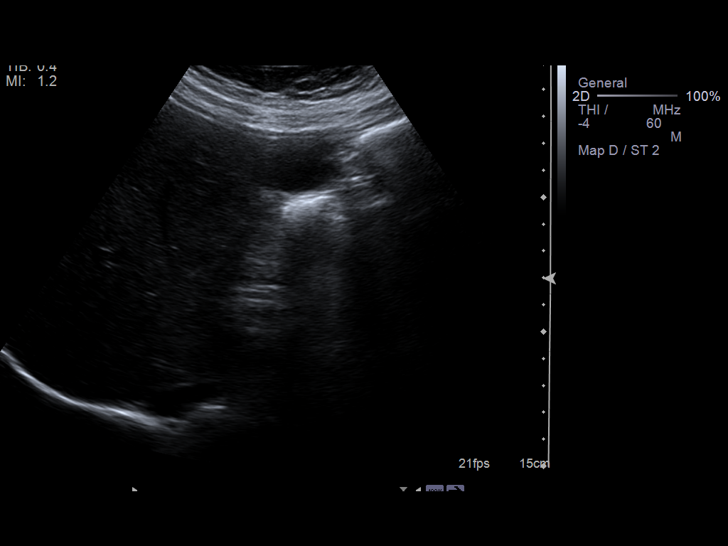

[14 of 25 positions shown; findings below may reference images not displayed]

FINDINGS: Gallbladder:  No gallstones, gallbladder wall thickening, or
pericholecystic fluid.

Common bile duct:  2.9 mm

Liver:  11 mm complex cyst left lobe of the liver.  9 mm simple
cyst right lobe of the liver.

IVC:  Not well visualized

Pancreas:  Not well visualized

Spleen:  4.5 cm

Right Kidney:  11.4 cm.  Negative for obstruction.  18 mm mid pole
cyst

Left Kidney:  11.6 cm.  Negative for obstruction.  2.5 cm mid pole
cyst.

Abdominal aorta:  No aneurysm identified.
IMPRESSION: Negative for gallstones.  Pancreas not well seen.

## 2015-09-18 ENCOUNTER — Inpatient Hospital Stay (HOSPITAL_COMMUNITY)
Admission: EM | Admit: 2015-09-18 | Discharge: 2015-09-19 | DRG: 639 | Disposition: A | Discharge status: Home / Self Care | Payer: 59 | Attending: Internal Medicine | Admitting: Internal Medicine

## 2015-09-18 ENCOUNTER — Inpatient Hospital Stay (HOSPITAL_COMMUNITY): Payer: 59

## 2015-09-18 ENCOUNTER — Encounter (HOSPITAL_COMMUNITY): Payer: Self-pay

## 2015-09-18 ENCOUNTER — Emergency Department (HOSPITAL_COMMUNITY): Payer: 59

## 2015-09-18 DIAGNOSIS — Z9114 Patient's other noncompliance with medication regimen: Secondary | ICD-10-CM | POA: Diagnosis not present

## 2015-09-18 DIAGNOSIS — F1020 Alcohol dependence, uncomplicated: Secondary | ICD-10-CM | POA: Diagnosis not present

## 2015-09-18 DIAGNOSIS — R0602 Shortness of breath: Secondary | ICD-10-CM | POA: Diagnosis present

## 2015-09-18 DIAGNOSIS — Z79899 Other long term (current) drug therapy: Secondary | ICD-10-CM

## 2015-09-18 DIAGNOSIS — E1110 Type 2 diabetes mellitus with ketoacidosis without coma: Secondary | ICD-10-CM

## 2015-09-18 DIAGNOSIS — Z794 Long term (current) use of insulin: Secondary | ICD-10-CM

## 2015-09-18 DIAGNOSIS — E1165 Type 2 diabetes mellitus with hyperglycemia: Secondary | ICD-10-CM | POA: Diagnosis not present

## 2015-09-18 DIAGNOSIS — Z833 Family history of diabetes mellitus: Secondary | ICD-10-CM | POA: Diagnosis not present

## 2015-09-18 DIAGNOSIS — E1310 Other specified diabetes mellitus with ketoacidosis without coma: Principal | ICD-10-CM | POA: Diagnosis present

## 2015-09-18 DIAGNOSIS — R112 Nausea with vomiting, unspecified: Secondary | ICD-10-CM | POA: Diagnosis not present

## 2015-09-18 DIAGNOSIS — E139 Other specified diabetes mellitus without complications: Secondary | ICD-10-CM

## 2015-09-18 LAB — URINE MICROSCOPIC-ADD ON

## 2015-09-18 LAB — COMPREHENSIVE METABOLIC PANEL WITH GFR
ALT: 18 U/L (ref 17–63)
AST: 14 U/L — ABNORMAL LOW (ref 15–41)
Albumin: 3.4 g/dL — ABNORMAL LOW (ref 3.5–5.0)
Alkaline Phosphatase: 100 U/L (ref 38–126)
Anion gap: 21 — ABNORMAL HIGH (ref 5–15)
BUN: 13 mg/dL (ref 6–20)
CO2: 9 mmol/L — ABNORMAL LOW (ref 22–32)
Calcium: 9.7 mg/dL (ref 8.9–10.3)
Chloride: 101 mmol/L (ref 101–111)
Creatinine, Ser: 1.52 mg/dL — ABNORMAL HIGH (ref 0.61–1.24)
GFR calc Af Amer: >60 mL/min
GFR calc non Af Amer: 53 mL/min — ABNORMAL LOW
Glucose, Bld: 401 mg/dL — ABNORMAL HIGH (ref 65–99)
Potassium: 5 mmol/L (ref 3.5–5.1)
Sodium: 131 mmol/L — ABNORMAL LOW (ref 135–145)
Total Bilirubin: 1.1 mg/dL (ref 0.3–1.2)
Total Protein: 8.2 g/dL — ABNORMAL HIGH (ref 6.5–8.1)

## 2015-09-18 LAB — BASIC METABOLIC PANEL
ANION GAP: 12 (ref 5–15)
Anion gap: 21 — ABNORMAL HIGH (ref 5–15)
Anion gap: 13 (ref 5–15)
BUN: 13 mg/dL (ref 6–20)
BUN: 12 mg/dL (ref 6–20)
BUN: 11 mg/dL (ref 6–20)
CALCIUM: 8.8 mg/dL — AB (ref 8.9–10.3)
CHLORIDE: 103 mmol/L (ref 101–111)
CHLORIDE: 108 mmol/L (ref 101–111)
CO2: 7 mmol/L — ABNORMAL LOW (ref 22–32)
CO2: 12 mmol/L — AB (ref 22–32)
CO2: 12 mmol/L — AB (ref 22–32)
CREATININE: 1.53 mg/dL — AB (ref 0.61–1.24)
CREATININE: 1.09 mg/dL (ref 0.61–1.24)
Calcium: 8.9 mg/dL (ref 8.9–10.3)
Calcium: 8.8 mg/dL — ABNORMAL LOW (ref 8.9–10.3)
Chloride: 111 mmol/L (ref 101–111)
Creatinine, Ser: 0.88 mg/dL (ref 0.61–1.24)
GFR calc Af Amer: >60 mL/min (ref >60)
GFR calc Af Amer: >60 mL/min (ref >60)
GFR calc Af Amer: >60 mL/min (ref >60)
GFR calc non Af Amer: 53 mL/min — ABNORMAL LOW (ref >60)
GFR calc non Af Amer: >60 mL/min (ref >60)
GFR calc non Af Amer: >60 mL/min (ref >60)
GLUCOSE: 284 mg/dL — AB (ref 65–99)
GLUCOSE: 175 mg/dL — AB (ref 65–99)
Glucose, Bld: 478 mg/dL — ABNORMAL HIGH (ref 65–99)
Potassium: 5.8 mmol/L — ABNORMAL HIGH (ref 3.5–5.1)
Potassium: 4.1 mmol/L (ref 3.5–5.1)
Potassium: 3.9 mmol/L (ref 3.5–5.1)
SODIUM: 131 mmol/L — AB (ref 135–145)
SODIUM: 133 mmol/L — AB (ref 135–145)
Sodium: 135 mmol/L (ref 135–145)

## 2015-09-18 LAB — CBC
HCT: 43.6 % (ref 39.0–52.0)
Hemoglobin: 15.1 g/dL (ref 13.0–17.0)
MCH: 33.9 pg (ref 26.0–34.0)
MCHC: 34.6 g/dL (ref 30.0–36.0)
MCV: 98 fL (ref 78.0–100.0)
PLATELETS: 442 10*3/uL — AB (ref 150–400)
RBC: 4.45 MIL/uL (ref 4.22–5.81)
RDW: 12.3 % (ref 11.5–15.5)
WBC: 12.1 10*3/uL — AB (ref 4.0–10.5)

## 2015-09-18 LAB — URINALYSIS, ROUTINE W REFLEX MICROSCOPIC
Bilirubin Urine: NEGATIVE
LEUKOCYTES UA: NEGATIVE
NITRITE: NEGATIVE
PROTEIN: 30 mg/dL — AB
Specific Gravity, Urine: 1.027 (ref 1.005–1.030)
pH: 5 (ref 5.0–8.0)

## 2015-09-18 LAB — CBG MONITORING, ED
GLUCOSE-CAPILLARY: 270 mg/dL — AB (ref 65–99)
GLUCOSE-CAPILLARY: 209 mg/dL — AB (ref 65–99)
Glucose-Capillary: 402 mg/dL — ABNORMAL HIGH (ref 65–99)
Glucose-Capillary: 443 mg/dL — ABNORMAL HIGH (ref 65–99)
Glucose-Capillary: 448 mg/dL — ABNORMAL HIGH (ref 65–99)
Glucose-Capillary: 401 mg/dL — ABNORMAL HIGH (ref 65–99)
Glucose-Capillary: 308 mg/dL — ABNORMAL HIGH (ref 65–99)
Glucose-Capillary: 211 mg/dL — ABNORMAL HIGH (ref 65–99)

## 2015-09-18 LAB — I-STAT VENOUS BLOOD GAS, ED
Acid-base deficit: 24 mmol/L — ABNORMAL HIGH (ref 0.0–2.0)
Bicarbonate: 4.9 meq/L — ABNORMAL LOW (ref 20.0–24.0)
O2 Saturation: 74 %
TCO2: 5 mmol/L (ref 0–100)
pCO2, Ven: 17.8 mmHg — ABNORMAL LOW (ref 45.0–50.0)
pH, Ven: 7.045 — CL (ref 7.250–7.300)
pO2, Ven: 55 mmHg — ABNORMAL HIGH (ref 31.0–45.0)

## 2015-09-18 LAB — LIPASE, BLOOD: LIPASE: 11 U/L (ref 11–51)

## 2015-09-18 LAB — GLUCOSE, CAPILLARY
GLUCOSE-CAPILLARY: 144 mg/dL — AB (ref 65–99)
Glucose-Capillary: 159 mg/dL — ABNORMAL HIGH (ref 65–99)
Glucose-Capillary: 132 mg/dL — ABNORMAL HIGH (ref 65–99)

## 2015-09-18 LAB — MRSA PCR SCREENING: MRSA by PCR: NEGATIVE

## 2015-09-18 MED ORDER — KCL IN DEXTROSE-NACL 20-5-0.45 MEQ/L-%-% IV SOLN
INTRAVENOUS | Status: DC
  Administered 2015-09-18 – 2015-09-19 (×2): via INTRAVENOUS
  Filled 2015-09-18 (×2): qty 1000

## 2015-09-18 MED ORDER — ONDANSETRON HCL 4 MG PO TABS: 4.0000 mg | ORAL_TABLET | Freq: Four times a day (QID) | ORAL | Status: DC | PRN

## 2015-09-18 MED ORDER — INSULIN GLARGINE 100 UNIT/ML ~~LOC~~ SOLN
20.0000 [IU] | Freq: Every day | SUBCUTANEOUS | Status: DC
  Administered 2015-09-18 – 2015-09-19 (×2): 20 [IU] via SUBCUTANEOUS
  Filled 2015-09-18 (×2): qty 0.2

## 2015-09-18 MED ORDER — ENOXAPARIN SODIUM 40 MG/0.4ML ~~LOC~~ SOLN
40.0000 mg | SUBCUTANEOUS | Status: DC
  Administered 2015-09-18: 40 mg via SUBCUTANEOUS
  Filled 2015-09-18: qty 0.4

## 2015-09-18 MED ORDER — KCL IN DEXTROSE-NACL 20-5-0.45 MEQ/L-%-% IV SOLN: INTRAVENOUS | Status: DC

## 2015-09-18 MED ORDER — SODIUM CHLORIDE 0.9 % IV BOLUS (SEPSIS)
1000.0000 mL | Freq: Once | INTRAVENOUS | Status: AC
Start: 2015-09-18 — End: 2015-09-18
  Administered 2015-09-18: 1000 mL via INTRAVENOUS

## 2015-09-18 MED ORDER — SODIUM CHLORIDE 0.9 % IV SOLN: INTRAVENOUS | Status: DC

## 2015-09-18 MED ORDER — ONDANSETRON HCL 4 MG/2ML IJ SOLN: 4.0000 mg | Freq: Four times a day (QID) | INTRAMUSCULAR | Status: DC | PRN

## 2015-09-18 MED ORDER — SODIUM CHLORIDE 0.9 % IV BOLUS (SEPSIS)
1000.0000 mL | Freq: Once | INTRAVENOUS | Status: AC
  Administered 2015-09-18: 1000 mL via INTRAVENOUS

## 2015-09-18 MED ORDER — INSULIN ASPART 100 UNIT/ML ~~LOC~~ SOLN
0.0000 [IU] | Freq: Three times a day (TID) | SUBCUTANEOUS | Status: DC
  Administered 2015-09-19: 8 [IU] via SUBCUTANEOUS
  Administered 2015-09-19 (×2): 5 [IU] via SUBCUTANEOUS

## 2015-09-18 MED ORDER — SODIUM CHLORIDE 0.9% FLUSH
3.0000 mL | Freq: Two times a day (BID) | INTRAVENOUS | Status: DC
  Administered 2015-09-18 – 2015-09-19 (×2): 3 mL via INTRAVENOUS

## 2015-09-18 MED ORDER — SODIUM CHLORIDE 0.9 % IV SOLN
INTRAVENOUS | Status: DC
  Administered 2015-09-18: 3.8 [IU]/h via INTRAVENOUS
  Filled 2015-09-18 (×2): qty 2.5

## 2015-09-18 NOTE — ED Notes (Signed)
Paged admitting per pt request for clear liquids.

## 2015-09-18 NOTE — ED Notes (Addendum)
Patient here with abdominal cramping, vomiting, shortness of breath and hasn't taken insulin x 2 days. Complains of weakness with same. Alert and oriented

## 2015-09-18 NOTE — ED Provider Notes (Signed)
Emergency Department Provider Note  Time seen: Approximately 12:10 PM  I have reviewed the triage vital signs and the nursing notes.   HISTORY  Chief Complaint Abdominal Cramping and Emesis   HPI Mark Kirby is a 50 y.o. male with PMH of pancreatitis and IDDM presents to the emergency department for evaluation of 3 days of intractable nausea and vomiting with some generalized abdominal discomfort. The patient states he has been unable to keep down any fluids including water. He denies associated diarrhea. Associated chest pain. He does have some progressively worsening dyspnea. He has not given himself any insulin over the past 2 days. He notes a prior history of DKA and states that this feels similar to that episode. No fevers, chills, other infection symptoms.   Past Medical History  Diagnosis Date  . Pancreatitis   . Seasonal allergies   . DKA (diabetic ketoacidoses) ([place]) 10/17/2012    Patient Active Problem List   Diagnosis Date Noted  . Type 2 diabetes mellitus ([place]) 09/18/2015  . SOB (shortness of breath)   . Diabetes mellitus, new onset ([place]) 10/18/2012  . Alcoholism in recovery ([place]) 10/18/2012  . DKA (diabetic ketoacidoses) ([place]) 10/17/2012  . Dehydration 10/17/2012  . Pancreatitis, acute 10/17/2012  . Nausea with vomiting 10/17/2012    Past Surgical History  Procedure Laterality Date  . No past surgeries      Current Outpatient Rx  Name  Route  Sig  Dispense  Refill  . Cholecalciferol (VITAMIN D PO)   Oral   Take 1 tablet by mouth daily.         Marland Kitchen ibuprofen (ADVIL,MOTRIN) 200 MG tablet   Oral   Take 400 mg by mouth every 4 (four) hours as needed (pain).         . insulin aspart (NOVOLOG) 100 UNIT/ML injection   Subcutaneous   Inject 10 Units into the skin 3 (three) times daily with meals.         . insulin glargine (LANTUS) 100 UNIT/ML injection   Subcutaneous   Inject 20 Units into the skin at bedtime.         . metFORMIN  (GLUCOPHAGE) 1000 MG tablet   Oral   Take 1,000 mg by mouth 2 (two) times daily with a meal.         . Multiple Vitamin (MULTIVITAMIN WITH MINERALS) TABS tablet   Oral   Take 1 tablet by mouth daily.         Marland Kitchen glucose blood test strip      Use as instructed   100 each   12   . glucose monitoring kit (FREESTYLE) monitoring kit   Does not apply   1 each by Does not apply route as needed for other. Or any available-please ensure receives appropriate test strips and lancets   1 each   0   . insulin NPH-regular (NOVOLIN 70/30) (70-30) 100 UNIT/ML injection   Subcutaneous   Inject 20 Units into the skin daily with supper. Use with the breakfast and supper meals. Patient not taking: Reported on 09/18/2015   10 mL   12   . insulin NPH-regular (NOVOLIN 70/30) (70-30) 100 UNIT/ML injection   Subcutaneous   Inject 24 Units into the skin daily with breakfast. Patient not taking: Reported on 09/18/2015   10 mL   12   . Insulin Syringes, Disposable, U-100 1 ML MISC      Use to give your prescribed insulin   500 each  0     Allergies Review of patient's allergies indicates no known allergies.  Family History  Problem Relation Age of Onset  . Diabetes Cousin   . Diabetes      Uncle    Social History Social History  Substance Use Topics  . Smoking status: Never Smoker   . Smokeless tobacco: None  . Alcohol Use: 0.0 oz/week    0 Standard drinks or equivalent per week     Comment: currently in treatment    Review of Systems  Constitutional: No fever/chills Eyes: No visual changes. ENT: No sore throat. Cardiovascular: Denies chest pain. Respiratory: Positive shortness of breath. Gastrointestinal: Positive abdominal pain.  Positive nausea and vomiting.  No diarrhea.  No constipation. Genitourinary: Negative for dysuria. Musculoskeletal: Negative for back pain. Skin: Negative for rash. Neurological: Negative for headaches, focal weakness or numbness.  10-point  ROS otherwise negative.  ____________________________________________   PHYSICAL EXAM:  VITAL SIGNS: ED Triage Vitals  Enc Vitals Group     BP 09/18/15 1056 146/100 mmHg     Pulse Rate 09/18/15 1056 108     Resp 09/18/15 1056 26     Temp 09/18/15 1056 97.8 F (36.6 C)     Temp Source 09/18/15 1056 Oral     SpO2 09/18/15 1056 100 %     Weight 09/18/15 1056 185 lb (83.915 kg)     Height 09/18/15 1056 '5\' 7"'  (1.702 m)     Pain Score 09/18/15 1055 6    Constitutional: Alert and oriented. Well appearing and in no acute distress. Eyes: Conjunctivae are normal. PERRL. EOMI. Head: Atraumatic. Nose: No congestion/rhinnorhea. Mouth/Throat: Mucous membranes are moist.  Oropharynx non-erythematous. Neck: No stridor.   Cardiovascular: Tachycardia. Good peripheral circulation. Grossly normal heart sounds.   Respiratory: Increased respiratory effort with pattern consistent with Kussmaul's respirations.  No retractions. Lungs CTAB. Gastrointestinal: Soft and nontender. No distention.  Musculoskeletal: No lower extremity tenderness nor edema. No gross deformities of extremities. Neurologic:  Normal speech and language. No gross focal neurologic deficits are appreciated.  Skin:  Skin is warm, dry and intact. No rash noted. Psychiatric: Mood and affect are normal. Speech and behavior are normal.  ____________________________________________   LABS (all labs ordered are listed, but only abnormal results are displayed)  Labs Reviewed  COMPREHENSIVE METABOLIC PANEL - Abnormal; Notable for the following:    Sodium 131 (*)    CO2 9 (*)    Glucose, Bld 401 (*)    Creatinine, Ser 1.52 (*)    Total Protein 8.2 (*)    Albumin 3.4 (*)    AST 14 (*)    GFR calc non Af Amer 53 (*)    Anion gap 21 (*)    All other components within normal limits  CBC - Abnormal; Notable for the following:    WBC 12.1 (*)    Platelets 442 (*)    All other components within normal limits  URINALYSIS, ROUTINE W  REFLEX MICROSCOPIC (NOT AT [facility]) - Abnormal; Notable for the following:    Glucose, UA >1000 (*)    Hgb urine dipstick TRACE (*)    Ketones, ur >80 (*)    Protein, ur 30 (*)    All other components within normal limits  BASIC METABOLIC PANEL - Abnormal; Notable for the following:    Sodium 131 (*)    Potassium 5.8 (*)    CO2 7 (*)    Glucose, Bld 478 (*)    Creatinine, Ser 1.53 (*)  GFR calc non Af Amer 53 (*)    Anion gap 21 (*)    All other components within normal limits  BASIC METABOLIC PANEL - Abnormal; Notable for the following:    Sodium 133 (*)    CO2 12 (*)    Glucose, Bld 284 (*)    Calcium 8.8 (*)    All other components within normal limits  URINE MICROSCOPIC-ADD ON - Abnormal; Notable for the following:    Squamous Epithelial / LPF 0-5 (*)    Bacteria, UA RARE (*)    Casts HYALINE CASTS (*)    All other components within normal limits  CBG MONITORING, ED - Abnormal; Notable for the following:    Glucose-Capillary 402 (*)    All other components within normal limits  I-STAT VENOUS BLOOD GAS, ED - Abnormal; Notable for the following:    pH, Ven 7.045 (*)    pCO2, Ven 17.8 (*)    pO2, Ven 55.0 (*)    Bicarbonate 4.9 (*)    Acid-base deficit 24.0 (*)    All other components within normal limits  CBG MONITORING, ED - Abnormal; Notable for the following:    Glucose-Capillary 443 (*)    All other components within normal limits  CBG MONITORING, ED - Abnormal; Notable for the following:    Glucose-Capillary 448 (*)    All other components within normal limits  CBG MONITORING, ED - Abnormal; Notable for the following:    Glucose-Capillary 401 (*)    All other components within normal limits  CBG MONITORING, ED - Abnormal; Notable for the following:    Glucose-Capillary 308 (*)    All other components within normal limits  CBG MONITORING, ED - Abnormal; Notable for the following:    Glucose-Capillary 270 (*)    All other components within normal limits  CBG  MONITORING, ED - Abnormal; Notable for the following:    Glucose-Capillary 209 (*)    All other components within normal limits  CBG MONITORING, ED - Abnormal; Notable for the following:    Glucose-Capillary 211 (*)    All other components within normal limits  LIPASE, BLOOD  BLOOD GAS, VENOUS  HEMOGLOBIN A1C  HIV ANTIBODY (ROUTINE TESTING)  BASIC METABOLIC PANEL  BASIC METABOLIC PANEL   ____________________________________________  RADIOLOGY  Dg Chest 2 View  09/18/2015  CLINICAL DATA:  Abdominal cramping, shortness of breath EXAM: CHEST  2 VIEW COMPARISON:  09/18/2015 FINDINGS: The heart size and mediastinal contours are within normal limits. Both lungs are clear. The visualized skeletal structures are unremarkable. IMPRESSION: No active cardiopulmonary disease. Electronically Signed   By: Kathreen Devoid   On: 09/18/2015 15:05   Dg Chest Portable 1 View  09/18/2015  CLINICAL DATA:  Slightly productive cough with shortness of breath for 2 days. History of diabetes and pancreatitis. EXAM: PORTABLE CHEST 1 VIEW COMPARISON:  10/17/2012. FINDINGS: 1158 hours. The heart size and mediastinal contours are normal. The lungs are clear. There is no pleural effusion or pneumothorax. No acute osseous findings are identified. There is posttraumatic deformity of the mid left clavicle consistent with an interval fracture with healing. IMPRESSION: No active cardiopulmonary process. Electronically Signed   By: Richardean Sale M.D.   On: 09/18/2015 12:08    ____________________________________________   PROCEDURES  Procedure(s) performed:   Procedures  CRITICAL CARE Performed by: Margette Fast Total critical care time: 40 minutes Critical care time was exclusive of separately billable procedures and treating other patients. Critical care was necessary to  treat or prevent imminent or life-threatening deterioration. Critical care was time spent personally by me on the following activities:  development of treatment plan with patient and/or surrogate as well as nursing, discussions with consultants, evaluation of patient's response to treatment, examination of patient, obtaining history from patient or surrogate, ordering and performing treatments and interventions, ordering and review of laboratory studies, ordering and review of radiographic studies, pulse oximetry and re-evaluation of patient's condition.  Nanda Quinton, MD Emergency Medicine ____________________________________________   INITIAL IMPRESSION / ASSESSMENT AND PLAN / ED COURSE  Pertinent labs & imaging results that were available during my care of the patient were reviewed by me and considered in my medical decision making (see chart for details).   Patient resents to the emergency department for evaluation of vomiting, abdominal discomfort, dyspnea in the setting of 2 days without any insulin. Patient states he's felt too weak to give himself insulin. Initial blood glucose is 402. CMP shows CO2 of 9 with anion gap of 21. Potassium 5. Treating DKA. Will give 2 L IV fluids again insulin infusion. Placed order for every hour glucose checks. We'll transition to dextrose containing fluids when blood sugar less than 250. In this clinical setting I have lower suspicion for alternative cause of the patient's dyspnea or other symptoms. Will monitor closely for complications and discuss with internal medicine team.  Started patient on IVF bolus with tachycardia. Will begin IV insulin infusion. Discussed admission with the internal medicine team and placed order for stepdown unit. Updated patient. Will continue to follow Q1H glucose closely.  ____________________________________________  FINAL CLINICAL IMPRESSION(S) / ED DIAGNOSES  Final diagnoses:  Diabetic ketoacidosis without coma associated with type 2 diabetes mellitus (HCC)  Non-intractable vomiting with nausea, unspecified vomiting type     MEDICATIONS GIVEN DURING  THIS VISIT:  Medications  insulin regular (NOVOLIN R,HUMULIN R) 250 Units in sodium chloride 0.9 % 250 mL (1 Units/mL) infusion (9.1 Units/hr Intravenous Rate/Dose Change 09/18/15 1908)  enoxaparin (LOVENOX) injection 40 mg (not administered)  sodium chloride flush (NS) 0.9 % injection 3 mL (not administered)  ondansetron (ZOFRAN) tablet 4 mg (not administered)    Or  ondansetron (ZOFRAN) injection 4 mg (not administered)  dextrose 5 % and 0.45 % NaCl with KCl 20 mEq/L infusion (not administered)  sodium chloride 0.9 % bolus 1,000 mL (0 mLs Intravenous Stopped 09/18/15 1250)  sodium chloride 0.9 % bolus 1,000 mL (0 mLs Intravenous Stopped 09/18/15 1504)  sodium chloride 0.9 % bolus 1,000 mL (0 mLs Intravenous Stopped 09/18/15 1557)     NEW OUTPATIENT MEDICATIONS STARTED DURING THIS VISIT:  None   Note:  This document was prepared using Dragon voice recognition software and may include unintentional dictation errors.  Nanda Quinton, MD Emergency Medicine  Margette Fast, MD 09/18/15 [phone]

## 2015-09-18 NOTE — ED Notes (Signed)
Pt returned to room  

## 2015-09-18 NOTE — H&P (Signed)
Date: 09/18/2015               Patient Name:  Mark Kirby MRN: [id number]  DOB: 1965-06-06 Age / Sex: 50 y.o., male   PCP: Provider Not In System         Medical Service: Internal Medicine Teaching Service         Attending Physician: Dr. Oval Linsey, MD    First Contact: Dr. Heber [place] Pager: [phone]  Second Contact: Dr. Arcelia Jew Pager: [phone]       After Hours (After 5p/  First Contact Pager: [phone]  weekends / holidays): Second Contact Pager: [phone]   Chief Complaint: Shortness of Breath  History of Present Illness: Mr. Mark Kirby is a 50 yo male with PMHx of DKA, Pancreatitis and seasonal allergies that presented to the ED for labored breathing.  Patient stated not feeling well on Sunday and started having nausea and vomiting.  Patient last took insulin sometime last week and not as scheduled.  Patient admitted to not taking his insulin because he was feeling well and did not think he needed it.  He continued his metformin. Once he started vomiting he was afraid of administering insulin because he was afraid his blood sugar would drop due to not eating well and vomiting.  He usually takes Novolog 10 units before meals and Lantus 20 units before bed.  He has had two episodes of DKA in the past.  He is a recovering alcoholic and was sober for 3 years prior to a relapse this past June where he states he would drink 1/2 gallon of liquor a day.  He said his last drink was the end of June and he is currently in outpatient rehab.  He denies any history of withdrawal seizures.  Patient is currently having nausea, vomiting, abdominal pain and labored breathing.  He denies chest pain or leg welling.  Meds: Current Facility-Administered Medications  Medication Dose Route Frequency Provider Last Rate Last Dose  . 0.9 %  sodium chloride infusion   Intravenous Continuous Margette Fast, MD      . insulin regular (NOVOLIN R,HUMULIN R) 250 Units in sodium chloride 0.9 % 250 mL (1 Units/mL)  infusion   Intravenous Continuous Margette Fast, MD 7.8 mL/hr at 09/18/15 1352 7.8 Units/hr at 09/18/15 1352  . sodium chloride 0.9 % bolus 1,000 mL  1,000 mL Intravenous Once Juliet Rude, MD       Current Outpatient Prescriptions  Medication Sig Dispense Refill  . Cholecalciferol (VITAMIN D PO) Take 1 tablet by mouth daily.    Marland Kitchen ibuprofen (ADVIL,MOTRIN) 200 MG tablet Take 400 mg by mouth every 4 (four) hours as needed (pain).    . insulin aspart (NOVOLOG) 100 UNIT/ML injection Inject 10 Units into the skin 3 (three) times daily with meals.    . insulin glargine (LANTUS) 100 UNIT/ML injection Inject 20 Units into the skin at bedtime.    . metFORMIN (GLUCOPHAGE) 1000 MG tablet Take 1,000 mg by mouth 2 (two) times daily with a meal.    . Multiple Vitamin (MULTIVITAMIN WITH MINERALS) TABS tablet Take 1 tablet by mouth daily.    Marland Kitchen glucose blood test strip Use as instructed 100 each 12  . glucose monitoring kit (FREESTYLE) monitoring kit 1 each by Does not apply route as needed for other. Or any available-please ensure receives appropriate test strips and lancets 1 each 0  . insulin NPH-regular (NOVOLIN 70/30) (70-30) 100 UNIT/ML injection Inject 20 Units  into the skin daily with supper. Use with the breakfast and supper meals. (Patient not taking: Reported on 09/18/2015) 10 mL 12  . insulin NPH-regular (NOVOLIN 70/30) (70-30) 100 UNIT/ML injection Inject 24 Units into the skin daily with breakfast. (Patient not taking: Reported on 09/18/2015) 10 mL 12  . Insulin Syringes, Disposable, U-100 1 ML MISC Use to give your prescribed insulin 500 each 0    Allergies: Allergies as of 09/18/2015  . (No Known Allergies)   Past Medical History  Diagnosis Date  . Pancreatitis   . Seasonal allergies   . DKA (diabetic ketoacidoses) ([place]) 10/17/2012    Family History:  Family History  Problem Relation Age of Onset  . Diabetes Cousin   . Diabetes      Uncle     Social History:  History of alcohol  abuse.  Currently in outpatient rehab.  Denies tobacco use or illicit drug use.  Review of Systems: A complete ROS was negative except as per HPI. Review of Systems  Constitutional: Positive for chills. Negative for fever.  Respiratory: Positive for shortness of breath. Negative for cough and sputum production.   Cardiovascular: Negative for chest pain and orthopnea.  Gastrointestinal: Positive for nausea, vomiting and abdominal pain.     Physical Exam: Blood pressure 146/100, pulse 108, temperature 97.8 F (36.6 C), temperature source Oral, resp. rate 26, height _0  (1.702 m), weight 185 lb (83.915 kg), SpO2 100 %. Physical Exam  Constitutional: He is oriented to person, place, and time.  Appears in mild distress  HENT:  Head: Normocephalic and atraumatic.  Eyes: Pupils are equal, round, and reactive to light.  Cardiovascular: Regular rhythm.   Tachycardic  Pulmonary/Chest: No stridor. He is in respiratory distress.  Kussmaul breathing  Abdominal: Soft. There is tenderness.  Musculoskeletal: He exhibits no edema.  Neurological: He is alert and oriented to person, place, and time.  Skin: Skin is warm and dry.   BMP Latest Ref Rng 09/18/2015 09/18/2015 09/18/2015  Glucose 65 - 99 mg/dL 175(H) 284(H) 478(H)  BUN 6 - 20 mg/dL _1 Creatinine 0.61 - 1.24 mg/dL 0.88 1.09 1.53(H)  Sodium 135 - 145 mmol/L 135 133(L) 131(L)  Potassium 3.5 - 5.1 mmol/L 3.9 4.1 5.8(H)  Chloride 101 - 111 mmol/L 111 108 103  CO2 22 - 32 mmol/L 12(L) 12(L) 7(L)  Calcium 8.9 - 10.3 mg/dL 8.8(L) 8.8(L) 8.9     CBC    Component Value Date/Time   WBC 12.1* 09/18/2015 1058   RBC 4.45 09/18/2015 1058   HGB 15.1 09/18/2015 1058   HCT 43.6 09/18/2015 1058   PLT 442* 09/18/2015 1058   MCV 98.0 09/18/2015 1058   MCH 33.9 09/18/2015 1058   MCHC 34.6 09/18/2015 1058   RDW 12.3 09/18/2015 1058      EKG: normal sinus rhythm   CXR:  FINDINGS: The heart size and mediastinal contours are within  normal limits. Both lungs are clear. The visualized skeletal structures are unremarkable.  IMPRESSION: No active cardiopulmonary disease.  Assessment & Plan by Problem: DKA (diabetic ketoacidoses) ([place]) On admission patient had and anion gap of 21, ketones in urine, Potassium of 5, glucose 401.  Patient is exhibiting Kussmaul breathing.   - started on NS 143m/hr  and transitioned to D5 1/2NS when CBG was less than 250 - Insulin drip  - currently K is 4.1.  K 253m was added to fluids - BMET every 2 hrs - NPO - Zofran  Alcoholism in recovery (  [place]) Not concerned about withdrawal as patient is in outpatient recovery program and last drink was 20 days ago. - Continue outpatient rehab  Type 2 diabetes mellitus ([place]) Patients symptoms suggest he may be a type I diabetic since he is very insulin dependent.  May benefit from outpatient referral to endocrinology.  Dispo: Admit patient to Observation with expected length of stay less than 2 midnights.  Signed: Valinda Party, DO 09/18/2015, 2:22 PM  Pager: [phone]

## 2015-09-19 DIAGNOSIS — E1165 Type 2 diabetes mellitus with hyperglycemia: Secondary | ICD-10-CM

## 2015-09-19 DIAGNOSIS — Z794 Long term (current) use of insulin: Secondary | ICD-10-CM

## 2015-09-19 DIAGNOSIS — F1020 Alcohol dependence, uncomplicated: Secondary | ICD-10-CM

## 2015-09-19 DIAGNOSIS — E1310 Other specified diabetes mellitus with ketoacidosis without coma: Principal | ICD-10-CM

## 2015-09-19 LAB — BASIC METABOLIC PANEL
ANION GAP: 10 (ref 5–15)
BUN: 10 mg/dL (ref 6–20)
CALCIUM: 8.5 mg/dL — AB (ref 8.9–10.3)
CO2: 14 mmol/L — ABNORMAL LOW (ref 22–32)
CREATININE: 0.89 mg/dL (ref 0.61–1.24)
Chloride: 106 mmol/L (ref 101–111)
GLUCOSE: 291 mg/dL — AB (ref 65–99)
Potassium: 3.6 mmol/L (ref 3.5–5.1)
Sodium: 130 mmol/L — ABNORMAL LOW (ref 135–145)

## 2015-09-19 LAB — HEMOGLOBIN A1C
Hgb A1c MFr Bld: 11.8 % — ABNORMAL HIGH (ref 4.8–5.6)
MEAN PLASMA GLUCOSE: 292 mg/dL

## 2015-09-19 LAB — GLUCOSE, CAPILLARY
GLUCOSE-CAPILLARY: 321 mg/dL — AB (ref 65–99)
GLUCOSE-CAPILLARY: 234 mg/dL — AB (ref 65–99)
Glucose-Capillary: 140 mg/dL — ABNORMAL HIGH (ref 65–99)
Glucose-Capillary: 201 mg/dL — ABNORMAL HIGH (ref 65–99)
Glucose-Capillary: 281 mg/dL — ABNORMAL HIGH (ref 65–99)

## 2015-09-19 LAB — HIV ANTIBODY (ROUTINE TESTING W REFLEX): HIV SCREEN 4TH GENERATION: NONREACTIVE

## 2015-09-19 MED ORDER — INSULIN GLARGINE 100 UNIT/ML ~~LOC~~ SOLN: 20.0000 [IU] | Freq: Every day | SUBCUTANEOUS | Status: DC

## 2015-09-19 MED ORDER — GLUCERNA SHAKE PO LIQD
237.0000 mL | Freq: Three times a day (TID) | ORAL | Status: DC
  Administered 2015-09-19: 237 mL via ORAL

## 2015-09-19 MED ORDER — INSULIN ASPART 100 UNIT/ML ~~LOC~~ SOLN
8.0000 [IU] | Freq: Once | SUBCUTANEOUS | Status: AC
  Administered 2015-09-19: 8 [IU] via SUBCUTANEOUS

## 2015-09-19 MED ORDER — INSULIN ASPART 100 UNIT/ML ~~LOC~~ SOLN
3.0000 [IU] | Freq: Three times a day (TID) | SUBCUTANEOUS | Status: DC
  Administered 2015-09-19 (×3): 3 [IU] via SUBCUTANEOUS

## 2015-09-19 MED ORDER — INSULIN ASPART 100 UNIT/ML ~~LOC~~ SOLN: 10.0000 [IU] | Freq: Three times a day (TID) | SUBCUTANEOUS | Status: DC

## 2015-09-19 MED ORDER — SODIUM CHLORIDE 0.9 % IV SOLN
INTRAVENOUS | Status: DC
  Administered 2015-09-19: 05:00:00 via INTRAVENOUS

## 2015-09-19 NOTE — Progress Notes (Signed)
Internal Medicine Attending  Date: 09/19/2015  Patient name: Bella KennedyWilliam Howard Hollinsworth Medical record number: [id number] Date of birth: Jun 30, 1965 Age: 50 y.o. Gender: male  I saw and evaluated the patient. I reviewed the resident's note by Dr. Glenard Haringatliff-Hoffman and I agree with the resident's findings and plans as documented in her progress note.  Please see my H&P dated 09/19/2015 and attached to Dr. Jenkins Rougeatliff-Hoffman's H&P dated 09/19/2015 for the specifics of my evaluation, assessment, and plan from earlier today.

## 2015-09-19 NOTE — Care Management Note (Signed)
Case Management Note  Patient Details  Name: Mark Kirby MRN: [id number] Date of Birth: [birth year]  Subjective/Objective:   CM received referral to assist pt in finding PCP.  This CM [facility] in [facility], pt now states he did not keep appointment.  Now has [employer] insurance and is interested in [facility] @ [place].  Provided pt with contact information.                              Expected Discharge Plan:  Home/Self Care  Discharge planning Services  CM Consult  Status of Service:  In process, will continue to follow  Magdalene RiverMayo, Tuana Hoheisel T, RN 09/19/2015, 2:22 PM

## 2015-09-19 NOTE — Discharge Summary (Signed)
Name: Mark Kirby MRN: [id number] DOB: May 14, 1965 50 y.o. PCP: Provider Not In System  Date of Admission: 09/18/2015 11:03 AM Date of Discharge: 09/19/2015 Attending Physician: Dr. Eppie Gibson  Discharge Diagnosis: 1. DKA (diabetic ketoacidoses) ([place])   Discharge Medications:   Medication List    STOP taking these medications   insulin NPH-regular Human (70-30) 100 UNIT/ML injection Commonly known as:  NOVOLIN 70/30     TAKE these medications   glucose blood test strip Use as instructed   glucose monitoring kit monitoring kit 1 each by Does not apply route as needed for other. Or any available-please ensure receives appropriate test strips and lancets   ibuprofen 200 MG tablet Commonly known as:  ADVIL,MOTRIN Take 400 mg by mouth every 4 (four) hours as needed (pain).   insulin aspart 100 UNIT/ML injection Commonly known as:  novoLOG Inject 10 Units into the skin 3 (three) times daily with meals.   insulin glargine 100 UNIT/ML injection Commonly known as:  LANTUS Inject 0.2 mLs (20 Units total) into the skin at bedtime.   Insulin Syringes (Disposable) U-100 1 ML Misc Use to give your prescribed insulin   metFORMIN 1000 MG tablet Commonly known as:  GLUCOPHAGE Take 1,000 mg by mouth 2 (two) times daily with a meal.   multivitamin with minerals Tabs tablet Take 1 tablet by mouth daily.   VITAMIN D PO Take 1 tablet by mouth daily.       Disposition and follow-up:   Mark Kirby was discharged from [facility] in stable condition.  At the hospital follow up visit please address:  1.  Patient appears to have adult onset type I diabetes.  Patient may benefit from additional diabetes counseling.     2.  Labs / imaging needed at time of follow-up: none  3.  Pending labs/ test needing follow-up: none  Follow-up Appointments: Follow-up Information    [place] On 09/26/2015.   Why:  10:45 AM FOR  HOSPITAL FOLLOW UP Contact information: [address] [place] [facility] Course by problem list:  DKA (diabetic ketoacidoses) [facility]) Mark Kirby is a 50 yo male with PMHx of DKA, Pancreatitis,Type II diabetes and seasonal allergies that presented to the ED for labored breathing.  He stopped his insulin a week prior to arriving to the ED because he felt well and did not think he needed to be taking insulin however, he did continue his metformin.  He had nausea, vomiting, and abdominal pain x 4 days until his labored breathing brought him into the ED.  Once he started vomiting he he did not administer insulin because he was afraid his blood sugar would drop due to not eating well and vomiting.   During admission he was placed on an insulin drip and given IV hydration.  His anion gap closed and he was transitioned to subcutaneous insulin. His anion gap remained closed throughout admission and upon discharge.  Potassium 42mq was added to fluids when patient's potassium had decreased to 4.1 and on discharge patients potassium was 3.6.  On the day of discharge the patient tolerated his lunch well and denied further nausea, vomiting or abdominal pain.  Patient no longer had labored breathing and ambulated the halls without difficulty.      Alcoholism in recovery Patient is in outpatient rehabilitation for alcohol abuse that he started in the beginning of July.  Patient  stated he had his last drink at the end of June.  During admission patient did not exhibit any alcohol withdrawal symptoms.   Type 2 diabetes mellitus   Patient has an appointment with [facility] for July 27 at 10:45am.  It was discussed that he may have diabetes that requires him to be dependent on insulin.  He articulated on admission that previous doctors have told him he has diabetes 1.5.  Patient stated he had insulin at home and refills were ordered upon discharge     Discharge Vitals:   BP  (!) 129/95 (BP Location: Right Arm)   Pulse 89   Temp 99 F (37.2 C) (Oral)   Resp 19   Ht '5\' 7"'  (1.702 m)   Wt 163 lb 2.3 oz (74 kg)   SpO2 99%   BMI 25.55 kg/m   Pertinent Labs, Studies, and Procedures:  EKG: Normal sinus rhythm at 92 bpm, normal axis, normal intervals, no significant Q waves, no LVH by voltage, good R wave progression, no ST or T wave changes.  Urinalysis:  Yellow, clear, specific gravity 1.027, pH 5.0, glucose greater than 1000, hemoglobin trace, ketones greater than 80, protein 30, nitrite negative, leukocytes negative, 0-5 red blood cells per high-power field, 0-5 white blood cells per high-power field    Discharge Instructions: Discharge Instructions    Diet - low sodium heart healthy    Complete by:  As directed   Discharge instructions    Complete by:  As directed   [place] AS FOLLOWS: NOVOLOG 10 UNITS PRIOR TO BREAKFAST LUNCH AND DINNER LANTUS 20 UNITS PRIOR TO BEDTIME  FOLLOW UP IN THE INTERNAL MEDICINE CLINIC ON 09/26/15 AT 10:45 AM.   Increase activity slowly    Complete by:  As directed      Signed: Valinda Party, DO 09/22/2015, 3:47 PM   Pager: [phone]

## 2015-09-19 NOTE — Progress Notes (Signed)
Inpatient Diabetes Program Recommendations  AACE/ADA: New Consensus Statement on Inpatient Glycemic Control (2015)  Target Ranges:  Prepandial:   less than 140 mg/dL      Peak postprandial:   less than 180 mg/dL (1-2 hours)      Critically ill patients:  140 - 180 mg/dL   Results for Mark KennedyGREEN, Mark Kirby (MRN [id number]) as of 09/19/2015 10:53  Ref. Range 09/18/2015 18:04 09/18/2015 19:05 09/18/2015 20:18 09/18/2015 21:26 09/18/2015 22:48 09/19/2015 00:17 09/19/2015 04:26 09/19/2015 08:35  Glucose-Capillary Latest Ref Range: 65-99 mg/dL 409209 (H) 811211 (H) 914159 (H) 144 (H) 132 (H) 140 (H) 321 (H) 234 (H)   Review of Glycemic Control  Current orders for Inpatient glycemic control: Lantus 20 units daily, Novolog 0-15 units TID with meals, Novolog 3 units TID with meals  Inpatient Diabetes Program Recommendations: Insulin - Basal: Noted patient received Lantus 20 units on 09/18/15 at 22:04 and since Lantus 20 units is ordered daily patient received Lantus 20 units this morning at 9:06 am.  Correction (SSI): Please consider ordering Novolog bedtime correction scale. A1C: A1C in process.  Thanks, Orlando PennerMarie Graylee Arutyunyan, RN, MSN, CDE Diabetes Coordinator Inpatient Diabetes Program [phone] (Team Pager from 8am to 5pm) [phone] ([facility] office) [phone] [facility] office) [phone] [facility] office)

## 2015-09-19 NOTE — Progress Notes (Signed)
Initial Nutrition Assessment  DOCUMENTATION CODES:   Not applicable  INTERVENTION:   Glucerna Shake po TID, each supplement provides 220 kcal and 10 grams of protein  NUTRITION DIAGNOSIS:   Inadequate oral intake related to poor appetite as evidenced by per patient/family report  GOAL:   Patient will meet greater than or equal to 90% of their needs  MONITOR:   PO intake, Supplement acceptance, Labs, Weight trends, I & O's  REASON FOR ASSESSMENT:   Malnutrition Screening Tool  ASSESSMENT:   50 yo Male with PMHx of DKA, Pancreatitis and seasonal allergies that presented to the ED for labored breathing. Patient stated not feeling well on Sunday and started having nausea and vomiting. Patient last took insulin sometime last week and not as scheduled. Patient admitted to not taking his insulin because he was feeling well and did not think he needed it. He continued his metformin. Once he started vomiting he was afraid of administering insulin because he was afraid his blood sugar would drop due to not eating well and vomiting. He usually takes Novolog 10 units before meals and Lantus 20 units before bed. He has had two episodes of DKA in the past. He is a recovering alcoholic and was sober for 3 years prior to a relapse this past June where he states he would drink 1/2 gallon of liquor a day. He said his last drink was the end of June and he is currently in outpatient rehab. He denies any history of withdrawal seizures. Patient is currently having nausea, vomiting, abdominal pain and labored breathing. He denies chest pain or leg welling.  Patient reports he was eating poorly for 2-3 days PTA. Had a little of his breakfast this AM. Noted pt is an alcoholic, however, in outpatient recovery program. Amenable to oral nutrition supplements during hospital stay >> will order. Nutrition focused physical exam completed.  No muscle or subcutaneous fat depletion noticed.  Diet Order:   Diet Carb Modified Fluid consistency:: Thin; Room service appropriate?: Yes  Skin:  Reviewed, no issues  Last BM:  7/16  Height:   Ht Readings from Last 1 Encounters:  09/18/15 5\' 7"  (1.702 m)    Weight:   Wt Readings from Last 1 Encounters:  09/18/15 163 lb 2.3 oz (74 kg)    Ideal Body Weight:  67.2 kg  BMI:  Body mass index is 25.55 kg/(m^2).  Estimated Nutritional Needs:   Kcal:  1900-2100  Protein:  95-105 gm  Fluid:  1.9-2.1 L  EDUCATION NEEDS:   No education needs identified at this time  Maureen ChattersKatie Nanami Whitelaw, RD, LDN Pager #: [phone] After-Hours Pager #: [phone]

## 2015-09-19 NOTE — Progress Notes (Signed)
Patient given discharge instructions which included follow up appts. Patient informed insulin can be picked up at his pharmacy. Patient verbalized understanding. Patient left with friend.

## 2015-09-19 NOTE — Discharge Instructions (Signed)
MAKE SURE TAKE TO TAKE YOUR INSULIN AS FOLLOWS:  NOVOLOG 10 UNITS PRIOR TO BREAKFAST LUNCH AND DINNER  LANTUS 20 UNITS PRIOR TO BEDTIME  FOLLOW UP IN THE INTERNAL MEDICINE CLINIC ON 09/26/15 AT 10:45 AM.

## 2015-09-19 NOTE — Progress Notes (Signed)
   Subjective: [name] was evaluated on rounds today.  He states his breathing was improved since yesterday.  He is able to converse without difficulty.  He has been able to eat his lunch and denies nausea or vomiting.  He denies chest pain or abdominal pain.   Objective: Vital signs in last 24 hours: Filed Vitals:   09/19/15 0555 09/19/15 0836 09/19/15 1257 09/19/15 1637  BP: 118/72 108/78 119/84 121/79  Pulse: 75  85 81  Temp: 98.6 F (37 C) 98.6 F (37 C) 98.5 F (36.9 C) 99 F (37.2 C)  TempSrc: Oral Oral Oral Oral  Resp: 12  21 14   Height:      Weight:      SpO2: 97%  98% 99%   Physical Exam Physical Exam  Constitutional: He is oriented to person, place, and time and well-developed, well-nourished, and in no distress.  HENT:  Head: Normocephalic and atraumatic.  Eyes: Pupils are equal, round, and reactive to light.  Cardiovascular: Normal rate, regular rhythm and normal heart sounds.   Pulmonary/Chest: Effort normal and breath sounds normal. No respiratory distress.  Abdominal: Soft. He exhibits no distension.  Musculoskeletal: He exhibits no edema.  Neurological: He is alert and oriented to person, place, and time.  Skin: Skin is warm and dry.  Psychiatric: Mood and affect normal.     Assessment/Plan: DKA (diabetic ketoacidoses) (HCC) Patient no longer has [place] breathing.  He tolerated his lunch well and per nurse had no difficulty ambulating the halls.  Anion gap has been closed for 3 consecutive BMPs. Patient was counseled on the importance of his insulin compliance.  On discharge he will be given refills to his insulin and he states he currently has insulin available at home to take now.    Alcoholism in recovery  - No signs of withdrawal symptoms - Continue outpatient alcohol rehab  Type 2 diabetes mellitus  [facility]  It was discussed that he may have diabetes that requires him to be dependent on insulin.   He articulated on admission that previous doctors have told him he has diabetes 1.5.  The use of metformin may need to be reassessed in his case. - Continue Novolog 10units before meals and Lantus 20 units before bed - [facility]     Dispo: Anticipated discharge today   LOS: 1 day   Camelia PhenesJessica Ratliff Swannie Milius, DO 09/19/2015, 4:41 PM Pager: [phone]

## 2015-09-26 ENCOUNTER — Ambulatory Visit: Payer: Managed Care, Other (non HMO)

## 2015-09-26 ENCOUNTER — Encounter: Payer: Self-pay | Admitting: *Deleted

## 2016-06-11 DIAGNOSIS — G9341 Metabolic encephalopathy: Secondary | ICD-10-CM | POA: Diagnosis not present

## 2016-06-11 DIAGNOSIS — E1111 Type 2 diabetes mellitus with ketoacidosis with coma: Secondary | ICD-10-CM | POA: Diagnosis not present

## 2016-06-11 DIAGNOSIS — E875 Hyperkalemia: Secondary | ICD-10-CM | POA: Diagnosis not present

## 2016-06-11 DIAGNOSIS — R402421 Glasgow coma scale score 9-12, in the field [EMT or ambulance]: Secondary | ICD-10-CM | POA: Diagnosis not present

## 2016-06-11 DIAGNOSIS — N179 Acute kidney failure, unspecified: Secondary | ICD-10-CM | POA: Diagnosis not present

## 2016-06-11 DIAGNOSIS — E1311 Other specified diabetes mellitus with ketoacidosis with coma: Secondary | ICD-10-CM | POA: Diagnosis not present

## 2016-06-19 DIAGNOSIS — E118 Type 2 diabetes mellitus with unspecified complications: Secondary | ICD-10-CM | POA: Diagnosis not present

## 2016-06-19 DIAGNOSIS — Z794 Long term (current) use of insulin: Secondary | ICD-10-CM | POA: Diagnosis not present

## 2016-07-01 DIAGNOSIS — E118 Type 2 diabetes mellitus with unspecified complications: Secondary | ICD-10-CM | POA: Diagnosis not present

## 2016-07-13 DIAGNOSIS — E113299 Type 2 diabetes mellitus with mild nonproliferative diabetic retinopathy without macular edema, unspecified eye: Secondary | ICD-10-CM | POA: Diagnosis not present

## 2016-07-22 DIAGNOSIS — Z8719 Personal history of other diseases of the digestive system: Secondary | ICD-10-CM | POA: Diagnosis not present

## 2016-07-22 DIAGNOSIS — E1065 Type 1 diabetes mellitus with hyperglycemia: Secondary | ICD-10-CM | POA: Diagnosis not present

## 2016-07-22 DIAGNOSIS — E119 Type 2 diabetes mellitus without complications: Secondary | ICD-10-CM | POA: Diagnosis not present

## 2016-07-22 DIAGNOSIS — Z794 Long term (current) use of insulin: Secondary | ICD-10-CM | POA: Diagnosis not present

## 2016-07-22 DIAGNOSIS — E7800 Pure hypercholesterolemia, unspecified: Secondary | ICD-10-CM | POA: Diagnosis not present

## 2016-07-22 DIAGNOSIS — E118 Type 2 diabetes mellitus with unspecified complications: Secondary | ICD-10-CM | POA: Diagnosis not present

## 2016-08-07 DIAGNOSIS — E118 Type 2 diabetes mellitus with unspecified complications: Secondary | ICD-10-CM | POA: Diagnosis not present

## 2016-08-25 DIAGNOSIS — E118 Type 2 diabetes mellitus with unspecified complications: Secondary | ICD-10-CM | POA: Diagnosis not present

## 2016-09-10 DIAGNOSIS — Z8719 Personal history of other diseases of the digestive system: Secondary | ICD-10-CM | POA: Diagnosis not present

## 2016-09-10 DIAGNOSIS — E1065 Type 1 diabetes mellitus with hyperglycemia: Secondary | ICD-10-CM | POA: Diagnosis not present

## 2016-09-10 DIAGNOSIS — E291 Testicular hypofunction: Secondary | ICD-10-CM | POA: Diagnosis not present

## 2016-09-10 DIAGNOSIS — E538 Deficiency of other specified B group vitamins: Secondary | ICD-10-CM | POA: Diagnosis not present

## 2016-12-11 DIAGNOSIS — Z8719 Personal history of other diseases of the digestive system: Secondary | ICD-10-CM | POA: Diagnosis not present

## 2016-12-11 DIAGNOSIS — E1065 Type 1 diabetes mellitus with hyperglycemia: Secondary | ICD-10-CM | POA: Diagnosis not present

## 2016-12-11 DIAGNOSIS — E7800 Pure hypercholesterolemia, unspecified: Secondary | ICD-10-CM | POA: Diagnosis not present

## 2017-06-18 IMAGING — DX DG CHEST 2V
2 series · 2 of 2 positions shown · non-contrast
Comparison: 09/18/2015

CLINICAL DATA: Abdominal cramping, shortness of breath

EXAM:
CHEST  2 VIEW

[chest pa]
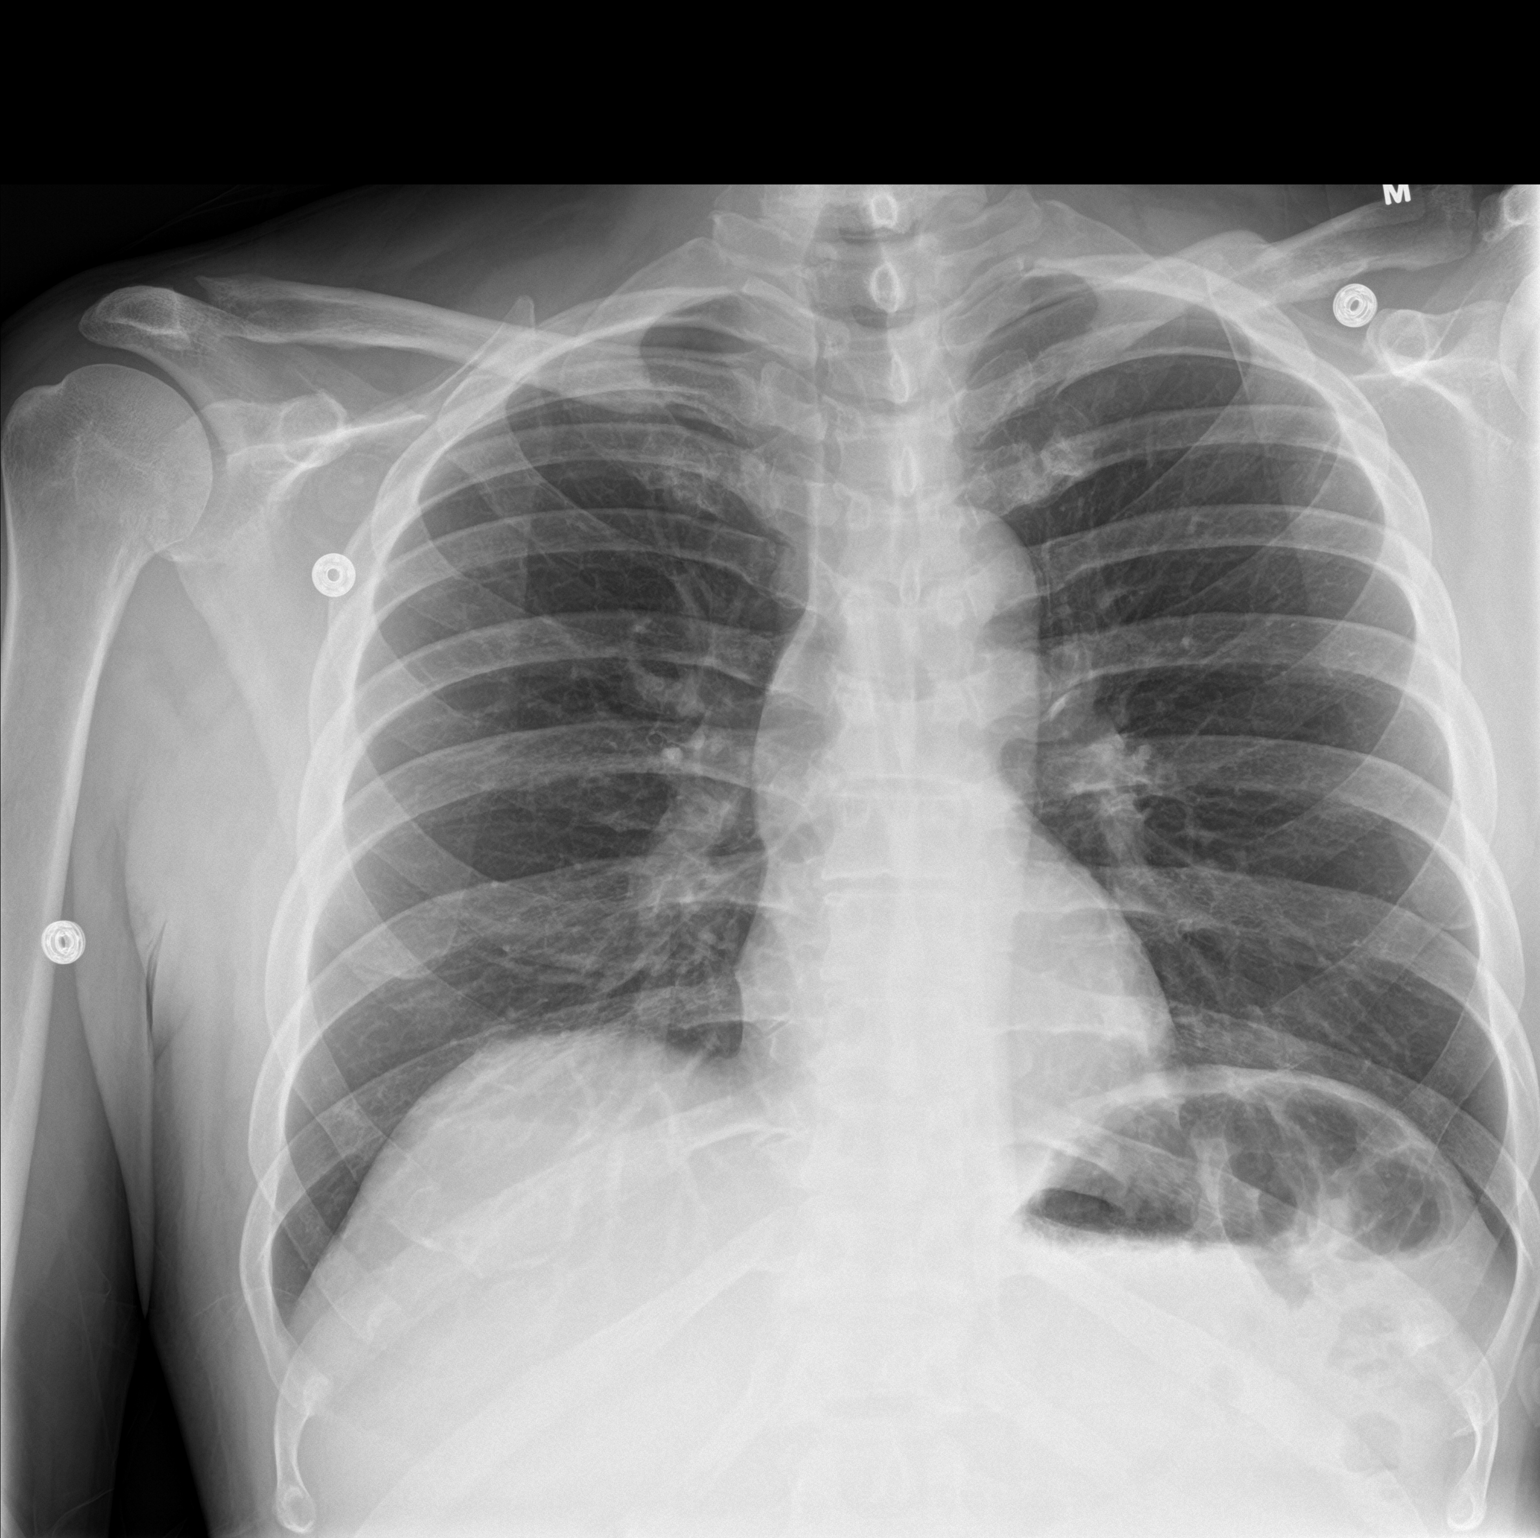

[chest lat]
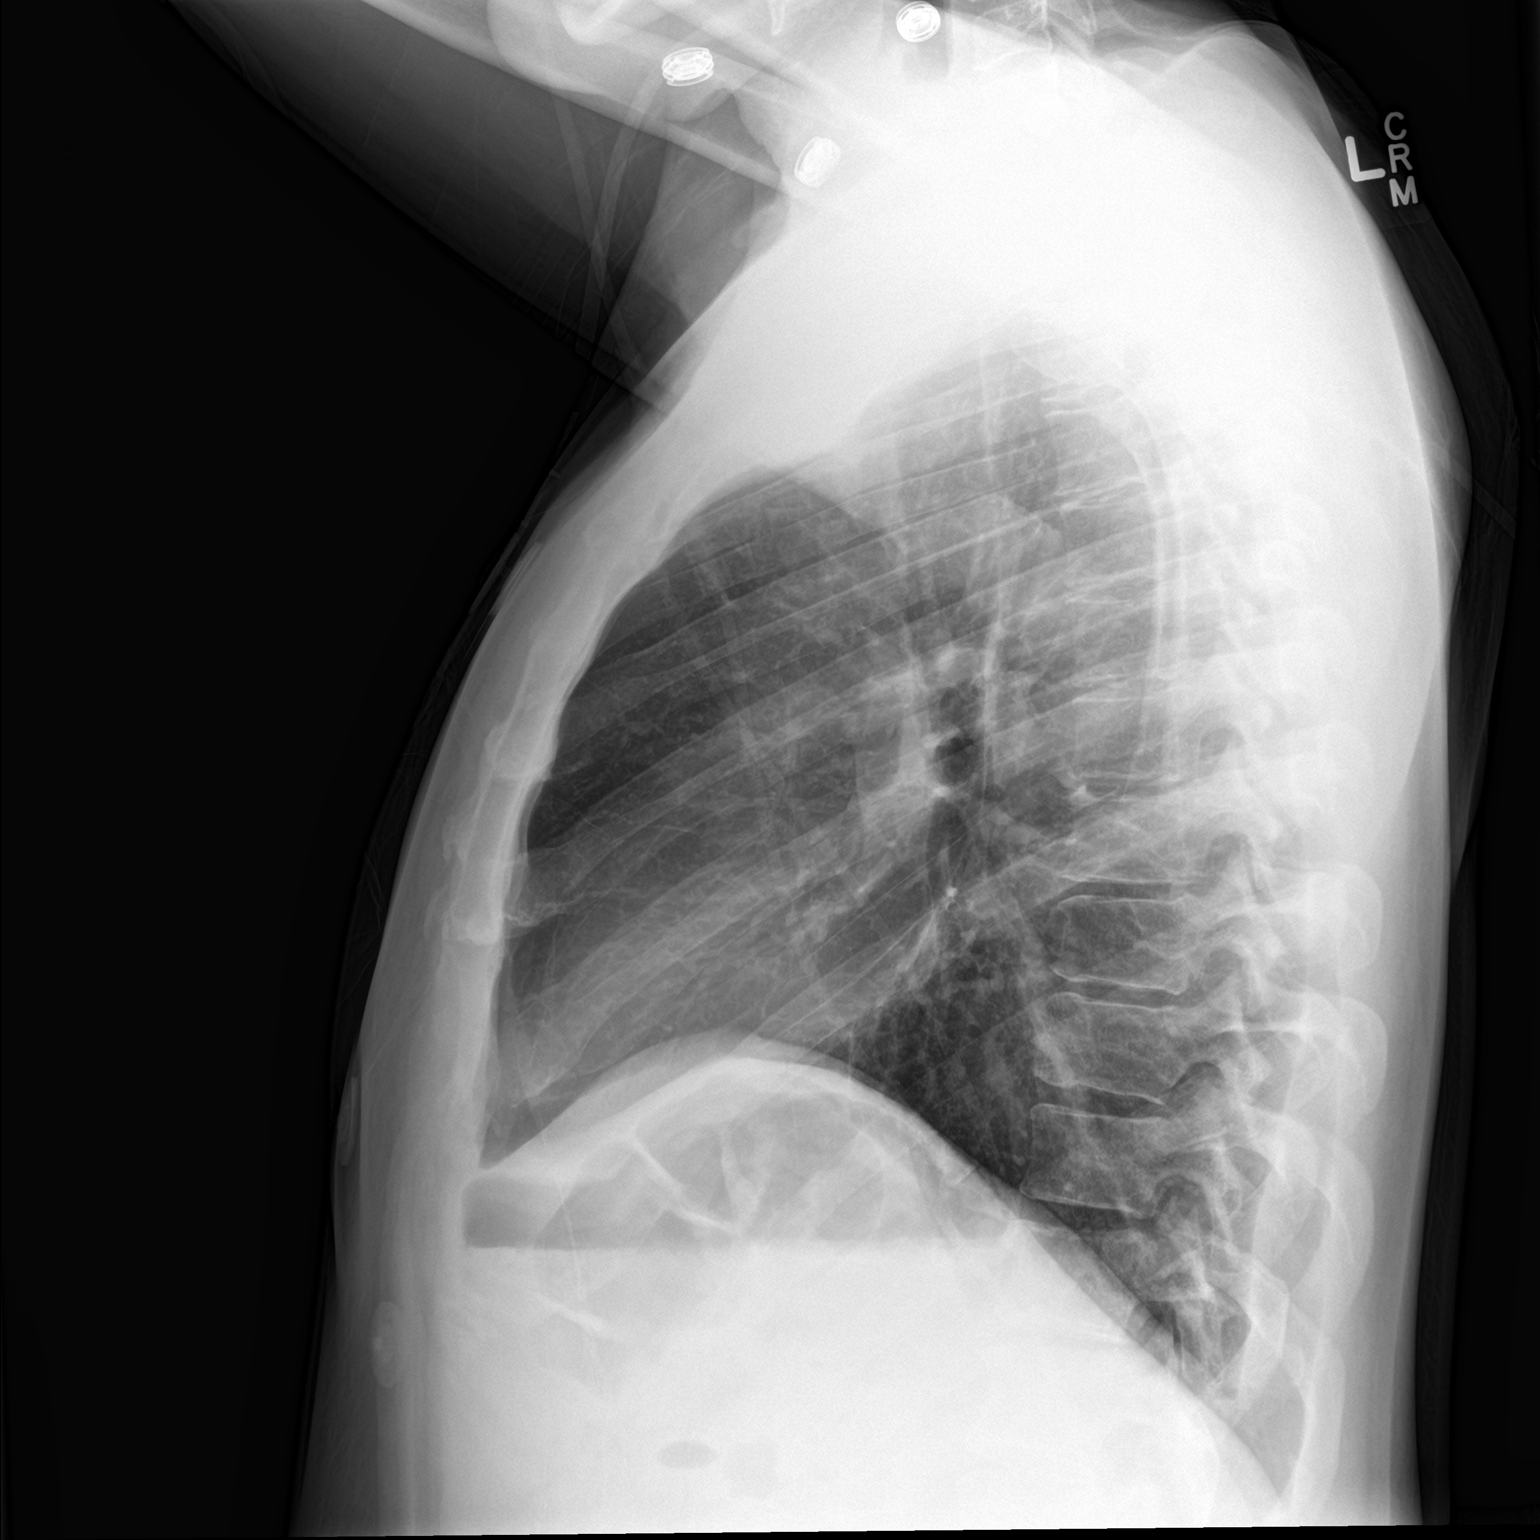

[2 of 2 positions shown; findings below may reference images not displayed]

FINDINGS: The heart size and mediastinal contours are within normal limits.
Both lungs are clear. The visualized skeletal structures are
unremarkable.
IMPRESSION: No active cardiopulmonary disease.

## 2018-05-03 ENCOUNTER — Encounter (HOSPITAL_COMMUNITY): Payer: Self-pay | Admitting: Emergency Medicine

## 2018-05-03 ENCOUNTER — Observation Stay (HOSPITAL_COMMUNITY)
Admission: EM | Admit: 2018-05-03 | Discharge: 2018-05-04 | Disposition: A | Discharge status: Home / Self Care | Payer: Self-pay | Attending: Student in an Organized Health Care Education/Training Program | Admitting: Student in an Organized Health Care Education/Training Program

## 2018-05-03 ENCOUNTER — Other Ambulatory Visit: Payer: Self-pay

## 2018-05-03 DIAGNOSIS — Z7984 Long term (current) use of oral hypoglycemic drugs: Secondary | ICD-10-CM | POA: Insufficient documentation

## 2018-05-03 DIAGNOSIS — Z79899 Other long term (current) drug therapy: Secondary | ICD-10-CM | POA: Insufficient documentation

## 2018-05-03 DIAGNOSIS — F10129 Alcohol abuse with intoxication, unspecified: Secondary | ICD-10-CM

## 2018-05-03 DIAGNOSIS — E1310 Other specified diabetes mellitus with ketoacidosis without coma: Secondary | ICD-10-CM

## 2018-05-03 DIAGNOSIS — E872 Acidosis: Secondary | ICD-10-CM | POA: Diagnosis present

## 2018-05-03 DIAGNOSIS — E8729 Other acidosis: Secondary | ICD-10-CM | POA: Diagnosis present

## 2018-05-03 DIAGNOSIS — Z9114 Patient's other noncompliance with medication regimen: Secondary | ICD-10-CM | POA: Insufficient documentation

## 2018-05-03 DIAGNOSIS — Z794 Long term (current) use of insulin: Secondary | ICD-10-CM

## 2018-05-03 DIAGNOSIS — Z791 Long term (current) use of non-steroidal anti-inflammatories (NSAID): Secondary | ICD-10-CM | POA: Insufficient documentation

## 2018-05-03 DIAGNOSIS — F1020 Alcohol dependence, uncomplicated: Secondary | ICD-10-CM | POA: Insufficient documentation

## 2018-05-03 DIAGNOSIS — E139 Other specified diabetes mellitus without complications: Secondary | ICD-10-CM | POA: Diagnosis present

## 2018-05-03 DIAGNOSIS — Z590 Homelessness: Secondary | ICD-10-CM | POA: Insufficient documentation

## 2018-05-03 DIAGNOSIS — R739 Hyperglycemia, unspecified: Secondary | ICD-10-CM | POA: Diagnosis present

## 2018-05-03 DIAGNOSIS — E1110 Type 2 diabetes mellitus with ketoacidosis without coma: Secondary | ICD-10-CM | POA: Diagnosis present

## 2018-05-03 DIAGNOSIS — F10920 Alcohol use, unspecified with intoxication, uncomplicated: Secondary | ICD-10-CM

## 2018-05-03 DIAGNOSIS — E1010 Type 1 diabetes mellitus with ketoacidosis without coma: Principal | ICD-10-CM

## 2018-05-03 LAB — COMPREHENSIVE METABOLIC PANEL
ALBUMIN: 4.1 g/dL (ref 3.5–5.0)
ALK PHOS: 79 U/L (ref 38–126)
ALT: 52 U/L — ABNORMAL HIGH (ref 0–44)
AST: 58 U/L — ABNORMAL HIGH (ref 15–41)
Anion gap: 19 — ABNORMAL HIGH (ref 5–15)
BUN: 6 mg/dL (ref 6–20)
CO2: 19 mmol/L — ABNORMAL LOW (ref 22–32)
Calcium: 8.7 mg/dL — ABNORMAL LOW (ref 8.9–10.3)
Chloride: 94 mmol/L — ABNORMAL LOW (ref 98–111)
Creatinine, Ser: 0.82 mg/dL (ref 0.61–1.24)
GFR calc Af Amer: >60 mL/min (ref >60)
GFR calc non Af Amer: >60 mL/min (ref >60)
GLUCOSE: 517 mg/dL — AB (ref 70–99)
Potassium: 3.2 mmol/L — ABNORMAL LOW (ref 3.5–5.1)
Sodium: 132 mmol/L — ABNORMAL LOW (ref 135–145)
Total Bilirubin: 0.5 mg/dL (ref 0.3–1.2)
Total Protein: 6.7 g/dL (ref 6.5–8.1)

## 2018-05-03 LAB — POCT I-STAT EG7
Acid-base deficit: 3 mmol/L — ABNORMAL HIGH (ref 0.0–2.0)
BICARBONATE: 19.7 mmol/L — AB (ref 20.0–28.0)
Calcium, Ion: 0.97 mmol/L — ABNORMAL LOW (ref 1.15–1.40)
HCT: 43 % (ref 39.0–52.0)
Hemoglobin: 14.6 g/dL (ref 13.0–17.0)
O2 Saturation: 94 %
Potassium: 3.4 mmol/L — ABNORMAL LOW (ref 3.5–5.1)
Sodium: 135 mmol/L (ref 135–145)
TCO2: 21 mmol/L — ABNORMAL LOW (ref 22–32)
pCO2, Ven: 30 mmHg — ABNORMAL LOW (ref 44.0–60.0)
pH, Ven: 7.425 (ref 7.250–7.430)
pO2, Ven: 67 mmHg — ABNORMAL HIGH (ref 32.0–45.0)

## 2018-05-03 LAB — URINALYSIS, ROUTINE W REFLEX MICROSCOPIC
Bacteria, UA: NONE SEEN
Bilirubin Urine: NEGATIVE
Glucose, UA: >=500 mg/dL — AB
Hgb urine dipstick: NEGATIVE
Ketones, ur: 5 mg/dL — AB
Leukocytes,Ua: NEGATIVE
Nitrite: NEGATIVE
Protein, ur: NEGATIVE mg/dL
SPECIFIC GRAVITY, URINE: 1.026 (ref 1.005–1.030)
pH: 6 (ref 5.0–8.0)

## 2018-05-03 LAB — CBG MONITORING, ED
GLUCOSE-CAPILLARY: 491 mg/dL — AB (ref 70–99)
Glucose-Capillary: 367 mg/dL — ABNORMAL HIGH (ref 70–99)

## 2018-05-03 LAB — CBC WITH DIFFERENTIAL/PLATELET
ABS IMMATURE GRANULOCYTES: 0.03 10*3/uL (ref 0.00–0.07)
Basophils Absolute: 0 10*3/uL (ref 0.0–0.1)
Basophils Relative: 1 %
Eosinophils Absolute: 0.1 10*3/uL (ref 0.0–0.5)
Eosinophils Relative: 1 %
HCT: 40.9 % (ref 39.0–52.0)
Hemoglobin: 14 g/dL (ref 13.0–17.0)
Immature Granulocytes: 1 %
LYMPHS PCT: 39 %
Lymphs Abs: 2 10*3/uL (ref 0.7–4.0)
MCH: 34.2 pg — ABNORMAL HIGH (ref 26.0–34.0)
MCHC: 34.2 g/dL (ref 30.0–36.0)
MCV: 100 fL (ref 80.0–100.0)
Monocytes Absolute: 0.4 10*3/uL (ref 0.1–1.0)
Monocytes Relative: 8 %
Neutro Abs: 2.6 10*3/uL (ref 1.7–7.7)
Neutrophils Relative %: 50 %
Platelets: 213 10*3/uL (ref 150–400)
RBC: 4.09 MIL/uL — ABNORMAL LOW (ref 4.22–5.81)
RDW: 12.9 % (ref 11.5–15.5)
WBC: 5.1 10*3/uL (ref 4.0–10.5)
nRBC: 0 % (ref 0.0–0.2)

## 2018-05-03 LAB — I-STAT TROPONIN, ED: Troponin i, poc: 0.01 ng/mL (ref 0.00–0.08)

## 2018-05-03 LAB — ETHANOL: Alcohol, Ethyl (B): 365 mg/dL (ref <10)

## 2018-05-03 LAB — LIPASE, BLOOD: Lipase: 18 U/L (ref 11–51)

## 2018-05-03 LAB — BETA-HYDROXYBUTYRIC ACID: Beta-Hydroxybutyric Acid: 0.63 mmol/L — ABNORMAL HIGH (ref 0.05–0.27)

## 2018-05-03 MED ORDER — SODIUM CHLORIDE 0.9 % IV BOLUS
1000.0000 mL | Freq: Once | INTRAVENOUS | Status: AC
  Administered 2018-05-03: 1000 mL via INTRAVENOUS

## 2018-05-03 MED ORDER — THIAMINE HCL 100 MG/ML IJ SOLN: 100.0000 mg | Freq: Every day | INTRAMUSCULAR | Status: DC

## 2018-05-03 MED ORDER — ADULT MULTIVITAMIN W/MINERALS CH
1.0000 | ORAL_TABLET | Freq: Every day | ORAL | Status: DC
  Administered 2018-05-04: 1 via ORAL
  Filled 2018-05-03: qty 1

## 2018-05-03 MED ORDER — ONDANSETRON HCL 4 MG/2ML IJ SOLN: 4.0000 mg | Freq: Four times a day (QID) | INTRAMUSCULAR | Status: DC | PRN

## 2018-05-03 MED ORDER — INSULIN ASPART 100 UNIT/ML ~~LOC~~ SOLN
0.0000 [IU] | Freq: Every day | SUBCUTANEOUS | Status: DC
  Administered 2018-05-04: 2 [IU] via SUBCUTANEOUS

## 2018-05-03 MED ORDER — INSULIN GLARGINE 100 UNIT/ML ~~LOC~~ SOLN
7.0000 [IU] | Freq: Every day | SUBCUTANEOUS | Status: DC
  Administered 2018-05-04: 7 [IU] via SUBCUTANEOUS
  Filled 2018-05-03 (×2): qty 0.07

## 2018-05-03 MED ORDER — ENOXAPARIN SODIUM 40 MG/0.4ML ~~LOC~~ SOLN
40.0000 mg | Freq: Every day | SUBCUTANEOUS | Status: DC
  Administered 2018-05-04: 40 mg via SUBCUTANEOUS
  Filled 2018-05-03: qty 0.4

## 2018-05-03 MED ORDER — ONDANSETRON HCL 4 MG PO TABS: 4.0000 mg | ORAL_TABLET | Freq: Four times a day (QID) | ORAL | Status: DC | PRN

## 2018-05-03 MED ORDER — VITAMIN B-1 100 MG PO TABS
100.0000 mg | ORAL_TABLET | Freq: Every day | ORAL | Status: DC
  Administered 2018-05-04: 100 mg via ORAL
  Filled 2018-05-03: qty 1

## 2018-05-03 MED ORDER — POTASSIUM CHLORIDE IN NACL 40-0.9 MEQ/L-% IV SOLN
INTRAVENOUS | Status: DC
  Administered 2018-05-03 – 2018-05-04 (×2): 125 mL/h via INTRAVENOUS
  Filled 2018-05-03 (×2): qty 1000

## 2018-05-03 MED ORDER — POTASSIUM CHLORIDE 10 MEQ/100ML IV SOLN
10.0000 meq | INTRAVENOUS | Status: AC
  Administered 2018-05-03 – 2018-05-04 (×4): 10 meq via INTRAVENOUS
  Filled 2018-05-03 (×4): qty 100

## 2018-05-03 MED ORDER — INSULIN ASPART 100 UNIT/ML ~~LOC~~ SOLN
5.0000 [IU] | Freq: Once | SUBCUTANEOUS | Status: AC
  Administered 2018-05-03: 5 [IU] via SUBCUTANEOUS

## 2018-05-03 MED ORDER — ACETAMINOPHEN 650 MG RE SUPP: 650.0000 mg | Freq: Four times a day (QID) | RECTAL | Status: DC | PRN

## 2018-05-03 MED ORDER — INSULIN ASPART 100 UNIT/ML ~~LOC~~ SOLN
0.0000 [IU] | SUBCUTANEOUS | Status: DC
  Administered 2018-05-03: 9 [IU] via SUBCUTANEOUS

## 2018-05-03 MED ORDER — ACETAMINOPHEN 325 MG PO TABS: 650.0000 mg | ORAL_TABLET | Freq: Four times a day (QID) | ORAL | Status: DC | PRN

## 2018-05-03 MED ORDER — DEXTROSE-NACL 5-0.45 % IV SOLN: INTRAVENOUS | Status: DC

## 2018-05-03 MED ORDER — FOLIC ACID 1 MG PO TABS
1.0000 mg | ORAL_TABLET | Freq: Every day | ORAL | Status: DC
  Administered 2018-05-04: 1 mg via ORAL
  Filled 2018-05-03: qty 1

## 2018-05-03 MED ORDER — POTASSIUM CHLORIDE 10 MEQ/100ML IV SOLN: 10.0000 meq | INTRAVENOUS | Status: DC

## 2018-05-03 MED ORDER — INSULIN ASPART 100 UNIT/ML ~~LOC~~ SOLN
0.0000 [IU] | Freq: Three times a day (TID) | SUBCUTANEOUS | Status: DC
  Administered 2018-05-04: 2 [IU] via SUBCUTANEOUS

## 2018-05-03 MED ORDER — INSULIN REGULAR(HUMAN) IN NACL 100-0.9 UT/100ML-% IV SOLN
INTRAVENOUS | Status: DC
  Filled 2018-05-03: qty 100

## 2018-05-03 NOTE — ED Provider Notes (Signed)
[place] EMERGENCY DEPARTMENT Provider Note   CSN: [id number] Arrival date & time: 05/03/18  2026    History   Chief Complaint Chief Complaint  Patient presents with  . Loss of Consciousness  . Alcohol Intoxication    HPI Mark Kirby is a 53 y.o. male history of type 1 diabetes, pancreatitis from alcohol, here presenting with syncope, hypoglycemia.  Patient states that he drinks alcohol all the time.  He states that he drinks about at least a pint of vodka daily.  He was at [employer] and was eating apparently passed out.  Patient denies any head injury at the time.  Patient states that he is taking metformin for his diabetes and supposed to be on insulin but has not been using it.  Patient also states that he is homeless and has not seen a doctor in the last several months.  Patient has a history of DKA in the past.      The history is provided by the patient.    Past Medical History:  Diagnosis Date  . DKA (diabetic ketoacidoses) ([place]) 10/17/2012  . Pancreatitis   . Seasonal allergies     Patient Active Problem List   Diagnosis Date Noted  . Alcoholic ketoacidosis 04/01/4386  . Hyperglycemia 05/03/2018  . Diabetes mellitus type 1.5 ([place]) 09/18/2015  . SOB (shortness of breath)   . Diabetes mellitus, new onset ([place]) 10/18/2012  . Alcoholism in recovery ([place]) 10/18/2012  . DKA (diabetic ketoacidoses) ([place]) 10/17/2012  . Dehydration 10/17/2012  . Pancreatitis, acute 10/17/2012  . Nausea with vomiting 10/17/2012    Past Surgical History:  Procedure Laterality Date  . NO PAST SURGERIES          Home Medications    Prior to Admission medications   Medication Sig Start Date End Date Taking? Authorizing Provider  ibuprofen (ADVIL,MOTRIN) 200 MG tablet Take 400 mg by mouth every 4 (four) hours as needed (pain).   Yes [provider]  Multiple Vitamin (MULTIVITAMIN WITH MINERALS) TABS tablet Take 1 tablet by mouth daily.   Yes  [provider]  glucose blood test strip Use as instructed Patient not taking: Reported on 05/03/2018 10/19/12   Samella Parr, NP  glucose monitoring kit (FREESTYLE) monitoring kit 1 each by Does not apply route as needed for other. Or any available-please ensure receives appropriate test strips and lancets Patient not taking: Reported on 05/03/2018 10/20/12   Samella Parr, NP  insulin aspart (NOVOLOG) 100 UNIT/ML injection Inject 10 Units into the skin 3 (three) times daily with meals. Patient not taking: Reported on 05/03/2018 09/19/15   Florinda Marker, MD  insulin glargine (LANTUS) 100 UNIT/ML injection Inject 0.2 mLs (20 Units total) into the skin at bedtime. Patient not taking: Reported on 05/03/2018 09/19/15   Florinda Marker, MD  Insulin Syringes, Disposable, U-100 1 ML MISC Use to give your prescribed insulin Patient not taking: Reported on 05/03/2018 10/19/12   Samella Parr, NP    Family History Family History  Problem Relation Age of Onset  . Diabetes Cousin   . Diabetes Other        Uncle    Social History Social History   Tobacco Use  . Smoking status: Never Smoker  . Smokeless tobacco: Never Used  Substance Use Topics  . Alcohol use: Yes    Alcohol/week: 0.0 standard drinks    Comment: currently in treatment  . Drug use: No    Comment:  hx     Allergies   Patient has no known allergies.   Review of Systems Review of Systems  Cardiovascular: Positive for syncope.  Neurological: Positive for dizziness and syncope.  All other systems reviewed and are negative.    Physical Exam Updated Vital Signs BP (!) 136/92   Pulse 95   Temp 98.8 F (37.1 C) (Oral)   Resp 19   Ht '5\' 7"'  (1.702 m)   Wt 83.9 kg   SpO2 95%   BMI 28.98 kg/m   Physical Exam Vitals signs and nursing note reviewed.  Constitutional:      Comments: Intoxicated, dehydrated   HENT:     Head: Normocephalic and atraumatic.     Comments: No obvious scalp hematoma     Nose: Nose  normal.     Mouth/Throat:     Mouth: Mucous membranes are dry.  Eyes:     Extraocular Movements: Extraocular movements intact.     Pupils: Pupils are equal, round, and reactive to light.  Neck:     Musculoskeletal: Normal range of motion.  Cardiovascular:     Rate and Rhythm: Normal rate and regular rhythm.  Pulmonary:     Effort: Pulmonary effort is normal.     Breath sounds: Normal breath sounds.  Abdominal:     General: Abdomen is flat.     Palpations: Abdomen is soft.  Musculoskeletal: Normal range of motion.  Skin:    General: Skin is warm.     Capillary Refill: Capillary refill takes less than 2 seconds.  Neurological:     General: No focal deficit present.     Mental Status: He is oriented to person, place, and time.     Cranial Nerves: No cranial nerve deficit.     Motor: No weakness.  Psychiatric:        Mood and Affect: Mood normal.      ED Treatments / Results  Labs (all labs ordered are listed, but only abnormal results are displayed) Labs Reviewed  CBC WITH DIFFERENTIAL/PLATELET - Abnormal; Notable for the following components:      Result Value   RBC 4.09 (*)    MCH 34.2 (*)    All other components within normal limits  COMPREHENSIVE METABOLIC PANEL - Abnormal; Notable for the following components:   Sodium 132 (*)    Potassium 3.2 (*)    Chloride 94 (*)    CO2 19 (*)    Glucose, Bld 517 (*)    Calcium 8.7 (*)    AST 58 (*)    ALT 52 (*)    Anion gap 19 (*)    All other components within normal limits  ETHANOL - Abnormal; Notable for the following components:   Alcohol, Ethyl (B) 365 (*)    All other components within normal limits  URINALYSIS, ROUTINE W REFLEX MICROSCOPIC - Abnormal; Notable for the following components:   Color, Urine STRAW (*)    Glucose, UA >=500 (*)    Ketones, ur 5 (*)    All other components within normal limits  BETA-HYDROXYBUTYRIC ACID - Abnormal; Notable for the following components:   Beta-Hydroxybutyric Acid 0.63  (*)    All other components within normal limits  CBG MONITORING, ED - Abnormal; Notable for the following components:   Glucose-Capillary 491 (*)    All other components within normal limits  POCT I-STAT EG7 - Abnormal; Notable for the following components:   pCO2, Ven 30.0 (*)    pO2, Ven 67.0 (*)  Bicarbonate 19.7 (*)    TCO2 21 (*)    Acid-base deficit 3.0 (*)    Potassium 3.4 (*)    Calcium, Ion 0.97 (*)    All other components within normal limits  CBG MONITORING, ED - Abnormal; Notable for the following components:   Glucose-Capillary 367 (*)    All other components within normal limits  LIPASE, BLOOD  HIV ANTIBODY (ROUTINE TESTING W REFLEX)  BASIC METABOLIC PANEL  HEMOGLOBIN D6L  BASIC METABOLIC PANEL  I-STAT TROPONIN, ED    EKG EKG Interpretation  Date/Time:  Tuesday May 03 2018 20:35:11 EST Ventricular Rate:  107 PR Interval:    QRS Duration: 78 QT Interval:  346 QTC Calculation: 462 R Axis:   31 Text Interpretation:  Sinus tachycardia Anteroseptal infarct, old No significant change since last tracing Confirmed by Wandra Arthurs [phone]) on 05/03/2018 8:40:17 PM   Radiology No results found.  Procedures Procedures (including critical care time)  CRITICAL CARE Performed by: Wandra Arthurs   Total critical care time: 30 minutes  Critical care time was exclusive of separately billable procedures and treating other patients.  Critical care was necessary to treat or prevent imminent or life-threatening deterioration.  Critical care was time spent personally by me on the following activities: development of treatment plan with patient and/or surrogate as well as nursing, discussions with consultants, evaluation of patient's response to treatment, examination of patient, obtaining history from patient or surrogate, ordering and performing treatments and interventions, ordering and review of laboratory studies, ordering and review of radiographic studies, pulse  oximetry and re-evaluation of patient's condition.   Medications Ordered in ED Medications  potassium chloride 10 mEq in 100 mL IVPB (10 mEq Intravenous New Bag/Given 05/03/18 2324)  0.9 % NaCl with KCl 40 mEq / L  infusion (125 mL/hr Intravenous New Bag/Given 05/03/18 2316)  enoxaparin (LOVENOX) injection 40 mg (has no administration in time range)  acetaminophen (TYLENOL) tablet 650 mg (has no administration in time range)    Or  acetaminophen (TYLENOL) suppository 650 mg (has no administration in time range)  ondansetron (ZOFRAN) tablet 4 mg (has no administration in time range)    Or  ondansetron (ZOFRAN) injection 4 mg (has no administration in time range)  thiamine (VITAMIN B-1) tablet 100 mg (has no administration in time range)    Or  thiamine (B-1) injection 100 mg (has no administration in time range)  folic acid (FOLVITE) tablet 1 mg (has no administration in time range)  multivitamin with minerals tablet 1 tablet (has no administration in time range)  insulin aspart (novoLOG) injection 0-9 Units (has no administration in time range)  insulin aspart (novoLOG) injection 0-5 Units (has no administration in time range)  insulin glargine (LANTUS) injection 7 Units (has no administration in time range)  sodium chloride 0.9 % bolus 1,000 mL (0 mLs Intravenous Stopped 05/03/18 2148)  insulin aspart (novoLOG) injection 5 Units (5 Units Subcutaneous Given 05/03/18 2147)  sodium chloride 0.9 % bolus 1,000 mL (0 mLs Intravenous Stopped 05/03/18 2323)    Followed by  sodium chloride 0.9 % bolus 1,000 mL (0 mLs Intravenous Stopped 05/03/18 2317)     Initial Impression / Assessment and Plan / ED Course  I have reviewed the triage vital signs and the nursing notes.  Pertinent labs & imaging results that were available during my care of the patient were reviewed by me and considered in my medical decision making (see chart for details).  Mark Kirby is a 53 y.o. male here with near  syncope, hyperglycemia. I think likely DKA vs hyperglycemia with dehydration and alcohol intoxication. Will get labs, VBG, UA, ETOH. Will hydrate patient and likely need insulin drip.   10 pm Patient's pH was 7.45. However, his glucose is 517 with AG of 19. Also ETOH is 365.  I think his anion gap acidosis can be secondary to dehydration, alcohol intoxication as well as early DKA.  He is started on insulin drip and was given IV fluids. Internal medicine teaching service to admit.    Final Clinical Impressions(s) / ED Diagnoses   Final diagnoses:  Diabetic ketoacidosis without coma associated with type 1 diabetes mellitus ([place])  Alcoholic intoxication without complication [facility])    ED Discharge Orders    None       Drenda Freeze, MD 05/03/18 2351

## 2018-05-03 NOTE — ED Triage Notes (Signed)
BIB EMS from [facility]. Reported syncopal episode. Pt hx of chronic ETOH abuse and reports "drinking a lot" today. Pt denies pain or obvious injury at this time. Hx of DM. CBG per EMS was 536.

## 2018-05-03 NOTE — Care Management (Signed)
ED CM spoke with the patient at the bedside. Patient does not have insurance and reports no PCP. ED CM provided the patient with a flyer for [facility]. Patient agrees to call the health center tomorrow to schedule an appointment if he is discharged tonight. Patient reports he has a "roof over his head." He lives with someone near [facility]. Patient reports he does not have money to pay for medication if any is prescribed. Reports he is taking drug test tomorrow for a job and is hopeful he will get the job. Patient will need transportation home if discharged tonight.

## 2018-05-03 NOTE — H&P (Signed)
Date: 05/03/2018               Patient Name:  Mark Kirby MRN: [id number]  DOB: [birth year] Age / Sex: 53 y.o., male   PCP: System, Provider Not In         Medical Service: Internal Medicine Teaching Service         Attending Physician: Dr. Oswaldo Done, Marquita Palms, *    First Contact: Dr. Karilyn Cota Pager: [phone]  Second Contact: Dr. 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 He states he was at [employer] and apparently passed out. He doesn't remember what happened but does remember being at [facility]. He knows he had a lot to drink but is unable to quantify the amount. He states he usually drinks a couple 40oz beers daily. He does not think he had any n/v, seizures, urinary or bowel incontinence around the time of the incident. He endorses a prior history of syncopal episodes in the setting of alcohol intoxication and not taking his insulin.   In regards to his diabetes, he was diagnosed in 2014 with type 1 diabetes but has had intermittent outpt f/u and insulin adherence due to financial difficulties. He follows with endocrinology but has not seen them since April of 2019. The last time he took any insulin was Oct 2019. He does continue to take metformin daily.   He denies n/v/d, abdominal pain, vision changes, headache, numbness or tingling, fevers, chills. He would like something to eat.    Meds:  Metformin 1,000mg  BID Current Meds  Medication Sig  . ibuprofen (ADVIL,MOTRIN) 200 MG tablet Take 400 mg by mouth every 4 (four) hours as needed (pain).  . Multiple Vitamin (MULTIVITAMIN WITH MINERALS) TABS tablet Take 1 tablet by mouth daily.     Allergies: Allergies as of  05/03/2018  . (No Known Allergies)   Past Medical History:  Diagnosis Date  . DKA (diabetic ketoacidoses) (HCC) 10/17/2012  . Pancreatitis   . Seasonal allergies     Family History:  Family History  Problem Relation Age of Onset  . Diabetes Cousin   . Diabetes Other        Uncle    Social History: homeless, unemployed since Jan 2019 (previously worked at a call center). Has lived in [place] for many years. No family around. Drinks a couple 40oz beers daily. Never smoker. Denies illicit drug use.   Review of Systems: A complete ROS was negative except as per HPI.   Physical Exam: Blood pressure 134/83, pulse 92, temperature 98.8 F (37.1 C), temperature source Oral, resp. rate 15, height 5\' 7"  (1.702 m), weight 83.9 kg, SpO2 95 %. Vitals:   05/03/18 2215 05/03/18 2230 05/03/18 2245 05/03/18 2300  BP:  133/86  134/83  Pulse: 91 92 89 92  Resp: 18 11 16 15   Temp:      TempSrc:      SpO2: 97% 97% 100% 95%  Weight:      Height:       General: Vital signs reviewed.  Patient is well-developed and well-nourished, in no acute distress and cooperative with exam.  Head: Normocephalic and atraumatic. Eyes: EOMI, conjunctivae normal, no scleral icterus.  Cardiovascular: RRR, S1 normal, S2 normal, no murmurs, gallops, or rubs. Pulmonary/Chest: Clear to auscultation bilaterally, no wheezes, rales, or rhonchi. Abdominal: Soft, non-tender, non-distended, BS +, no masses, organomegaly, or guarding present.  Extremities: No lower extremity edema bilaterally,  pulses symmetric and intact bilaterally. No cyanosis or clubbing. Neurological: A&O x3, Strength is normal and symmetric bilaterally, cranial nerve II-XII are grossly intact, no focal motor deficit, sensory intact to light touch bilaterally.  Skin: Warm, dry and intact. No rashes or erythema. Psychiatric: Normal mood and affect. speech and behavior is normal. Cognition and memory are normal.    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   Alcoholic Ketoacidosis: Passed out at [employer]. EtOH level >300 with ketoacidosis. Denies prior history of withdrawal or withdrawal seizures. S/p 3L NS. Alert and oriented x 4.  - Aggressively hydrating - CIWA - thiamine, folate, multivitamin  Hyperglycemia, Type 1.5 DM: CBG >500 initially. Does not meet criteria for DKA. Asymptomatic. Last a1c 12.8 (Feb 2019). Does not check his sugars routinely. Nonadherent with home lantus 30U qhs. Does take metformin.  - Aggressive IVF  - 7U Lantus qhs - SubQ insulin (sensitive)  - replete potassium  - a1c  FEN/GI: CM diet, NS 125cc/ml with kcl  Code: Full, confirmed with patient  DVT ppx: Lovenox   Dispo: Admit patient to Observation with expected length of stay less than 2 midnights.  Signed: Ali Lowe, MD 05/03/2018, 11:15 PM  Pager: [phone]

## 2018-05-04 DIAGNOSIS — Z7289 Other problems related to lifestyle: Secondary | ICD-10-CM

## 2018-05-04 DIAGNOSIS — E8889 Other specified metabolic disorders: Secondary | ICD-10-CM

## 2018-05-04 DIAGNOSIS — E1365 Other specified diabetes mellitus with hyperglycemia: Secondary | ICD-10-CM

## 2018-05-04 LAB — BASIC METABOLIC PANEL
Anion gap: 10 (ref 5–15)
Anion gap: 11 (ref 5–15)
BUN: 5 mg/dL — ABNORMAL LOW (ref 6–20)
BUN: 5 mg/dL — ABNORMAL LOW (ref 6–20)
CALCIUM: 7.7 mg/dL — AB (ref 8.9–10.3)
CO2: 20 mmol/L — ABNORMAL LOW (ref 22–32)
CO2: 22 mmol/L (ref 22–32)
Calcium: 7.8 mg/dL — ABNORMAL LOW (ref 8.9–10.3)
Chloride: 107 mmol/L (ref 98–111)
Chloride: 107 mmol/L (ref 98–111)
Creatinine, Ser: 0.68 mg/dL (ref 0.61–1.24)
Creatinine, Ser: 0.71 mg/dL (ref 0.61–1.24)
GFR calc Af Amer: >60 mL/min (ref >60)
GFR calc Af Amer: >60 mL/min (ref >60)
GFR calc non Af Amer: >60 mL/min (ref >60)
GFR calc non Af Amer: >60 mL/min (ref >60)
Glucose, Bld: 124 mg/dL — ABNORMAL HIGH (ref 70–99)
Glucose, Bld: 330 mg/dL — ABNORMAL HIGH (ref 70–99)
Potassium: 3.2 mmol/L — ABNORMAL LOW (ref 3.5–5.1)
Potassium: 3.3 mmol/L — ABNORMAL LOW (ref 3.5–5.1)
Sodium: 138 mmol/L (ref 135–145)
Sodium: 139 mmol/L (ref 135–145)

## 2018-05-04 LAB — HEMOGLOBIN A1C
Hgb A1c MFr Bld: 12 % — ABNORMAL HIGH (ref 4.8–5.6)
Mean Plasma Glucose: 297.7 mg/dL

## 2018-05-04 LAB — GLUCOSE, CAPILLARY
GLUCOSE-CAPILLARY: 246 mg/dL — AB (ref 70–99)
Glucose-Capillary: 151 mg/dL — ABNORMAL HIGH (ref 70–99)
Glucose-Capillary: 90 mg/dL (ref 70–99)

## 2018-05-04 LAB — HIV ANTIBODY (ROUTINE TESTING W REFLEX): HIV Screen 4th Generation wRfx: NONREACTIVE

## 2018-05-04 MED ORDER — INSULIN GLARGINE 100 UNIT/ML ~~LOC~~ SOLN: 20.0000 [IU] | Freq: Every day | SUBCUTANEOUS | 0 refills | Status: DC

## 2018-05-04 MED ORDER — POTASSIUM CHLORIDE CRYS ER 20 MEQ PO TBCR
40.0000 meq | EXTENDED_RELEASE_TABLET | Freq: Two times a day (BID) | ORAL | Status: DC
  Administered 2018-05-04: 40 meq via ORAL
  Filled 2018-05-04: qty 2

## 2018-05-04 MED ORDER — INFLUENZA VAC SPLIT QUAD 0.5 ML IM SUSY: 0.5000 mL | PREFILLED_SYRINGE | INTRAMUSCULAR | Status: DC | PRN

## 2018-05-04 MED ORDER — INSULIN ASPART 100 UNIT/ML ~~LOC~~ SOLN: 10.0000 [IU] | Freq: Three times a day (TID) | SUBCUTANEOUS | 0 refills | Status: DC

## 2018-05-04 MED ORDER — POTASSIUM CHLORIDE CRYS ER 20 MEQ PO TBCR
40.0000 meq | EXTENDED_RELEASE_TABLET | Freq: Two times a day (BID) | ORAL | Status: AC
  Administered 2018-05-04: 40 meq via ORAL
  Filled 2018-05-04: qty 2

## 2018-05-04 MED ORDER — NALTREXONE HCL 50 MG PO TABS: 50.0000 mg | ORAL_TABLET | Freq: Every day | ORAL | 0 refills | Status: DC

## 2018-05-04 MED ORDER — METFORMIN HCL 1000 MG PO TABS: 1000.0000 mg | ORAL_TABLET | Freq: Two times a day (BID) | ORAL | 0 refills | Status: DC

## 2018-05-04 MED ORDER — PNEUMOCOCCAL VAC POLYVALENT 25 MCG/0.5ML IJ INJ: 0.5000 mL | INJECTION | INTRAMUSCULAR | Status: DC | PRN

## 2018-05-04 MED FILL — PENTIPS 31G X 8 MM MISC: 31G X 8 MM | 30 days supply | Qty: 100 | Fill #0

## 2018-05-04 MED FILL — NALTREXONE HCL 50 MG TABS: 50 | 30 days supply | Qty: 30 | Fill #0

## 2018-05-04 MED FILL — LANTUS SOLOSTAR 100 UNITS/M: 100 | 30 days supply | Qty: 6 | Fill #0

## 2018-05-04 MED FILL — metFORMIN HCL 1000 MG TABS: 1000 | 30 days supply | Qty: 60 | Fill #0

## 2018-05-04 NOTE — Care Management Note (Addendum)
Case Management Note  Patient Details  Name: Mark Kirby MRN: [id number] Date of Birth: [birth year]  Subjective/Objective:                    Action/Plan: Patient address: [address] [place] [address]  Confirmed with patient he does not have insurance. Entered in [facility]. Asked MD to send prescriptions to [facility].   Patient will call friends to see if someone will transport him home.   Patient currently on oxygen , not on home oxygen. Bedside nurse will wean.  Expected Discharge Date:  05/04/18               Expected Discharge Plan:  Home/Self Care  In-House Referral:  Financial Counselor  Discharge planning Services  CM Consult, Indigent Health Clinic, [facility] Program, Medication Assistance  Post Acute Care Choice:  NA Choice offered to:  Patient  DME Arranged:  N/A DME Agency:  NA  HH Arranged:  NA HH Agency:  NA  Status of Service:  Completed, signed off  If discussed at Long Length of Stay Meetings, dates discussed:    Additional Comments:  Kingsley Plan, RN 05/04/2018, 11:43 AM

## 2018-05-04 NOTE — Progress Notes (Addendum)
   Subjective: No acute events since admission. Mark Kirby is doing well this morning. He denies any nausea, vomiting, lightheadedness, tremor, or confusion. He recognizes that his alcohol use is contributing to worsening blood glucose control, and that his blood glucose is poorly controlled. He does not have a PCP, but has followed with an endocrinologist in [facility] in the past. He moved to [place] from [place] last year and would like a local doctor. He would like to be discharged to make a urine drug screen for his hopeful employer this afternoon.  Objective:  Vital signs in last 24 hours: Vitals:   05/03/18 2330 05/04/18 0000 05/04/18 0044 05/04/18 0514  BP: (!) 136/92 126/85 (!) 143/90 (!) 145/91  Pulse: 95 93 91 89  Resp: 19 17 16 16   Temp:   99.5 F (37.5 C) 98.6 F (37 C)  TempSrc:   Oral Oral  SpO2: 95% 96% 95% 97%  Weight:   85.6 kg   Height:   5\' 7"  (1.702 m)    Physical exam General: Patient appears comfortable, moist mucous membranes Cardiovascular: Regular rate and rhythm. No murmurs, heaves, or thrills. Capillary refill < 2 seconds.  Respiratory: Lungs are clear to auscultation bilaterally Abdominal: No tenderness, distention, or appreciable organomegaly Neurologic: alert and oriented x3  Assessment/Plan: Principal Problem:   Alcoholic ketoacidosis Active Problems:   DKA (diabetic ketoacidoses) (HCC)   Diabetes mellitus type 1.5 (HCC)   Hyperglycemia   Alcoholic Ketosis: We discussed the impact of alcohol consumption on [name] syncopal episode and current hospitalization. He is interested in reducing alcohol intake, but hasn't been able to in the past. [name] was amenable to trying naltrexone. --counseled patient on importance of reduced alcohol intake --initiate naltrexone 50 mg/day to reduce alcohol cravings  Hyperglycemia: Patient has been out of insulin, unable to afford refills. Hyperglycemia improved with IVFs and sub q insulin. Received 7  units of lantus yesterday evening in addition to SSI.  --repeat CBG at 0800 was 151, decreased from 491 on admission.  --repleting potassium --Stop IVFs, encourage oral rehydration  Diabetes Mellitus: Mr. Want has difficulty affording his medications and does not always have access to a refrigerator. His diabetes is uncontrolled, with an HbA1c of 12%, but improved, compared to an HbA1c of 17% in 2017. He has a home glucometer.  -- Continue home lantus 20 units QHS -- Continue home novolog 10 units TID AC -- Continue home metforming --Schedule outpatient follow-up with [facility] -- Patient provided with match letter and 1 month supply of his medications, [facility]  -- Follow up endocrinology    Dispo: Anticipated discharge is today  LOS: 0 days   Helayne Seminole, Medical Student 05/04/2018, 11:27 AM  Attestation for Student Documentation:  I personally was present and performed or re-performed the history, physical exam and medical decision-making activities of this service and have verified that the service and findings are accurately documented in the student's note.  Reymundo Poll, MD 05/04/2018, 12:45 PM

## 2018-05-10 NOTE — Discharge Summary (Signed)
Name: Mark Kirby MRN: [id number] DOB: 03-02-1966 53 y.o. PCP: System, Provider Not In  Date of Admission: 05/03/2018  8:26 PM Date of Discharge: 05/04/2018 Attending Physician: Dr. Lalla Brothers  Discharge Diagnosis: 1. Alcoholic ketoacidosis  2. Hyperglycemia  Discharge Medications: Allergies as of 05/04/2018   No Known Allergies     Medication List    TAKE these medications   glucose blood test strip Use as instructed   glucose monitoring kit monitoring kit 1 each by Does not apply route as needed for other. Or any available-please ensure receives appropriate test strips and lancets   ibuprofen 200 MG tablet Commonly known as:  ADVIL,MOTRIN Take 400 mg by mouth every 4 (four) hours as needed (pain).   insulin aspart 100 UNIT/ML injection Commonly known as:  novoLOG Inject 10 Units into the skin 3 (three) times daily with meals.   insulin glargine 100 UNIT/ML injection Commonly known as:  LANTUS Inject 0.2 mLs (20 Units total) into the skin at bedtime.   Insulin Syringes (Disposable) U-100 1 ML Misc Use to give your prescribed insulin   metFORMIN 1000 MG tablet Commonly known as:  Glucophage Take 1 tablet (1,000 mg total) by mouth 2 (two) times daily with a meal.   multivitamin with minerals Tabs tablet Take 1 tablet by mouth daily.   naltrexone 50 MG tablet Commonly known as:  DEPADE Take 1 tablet (50 mg total) by mouth daily.        Disposition and follow-up:   MarkMark Kirby was discharged from [facility] in Stable condition.  At the hospital follow up visit please address:  1.  Alcohol Use Disorder: Started on naltrexone at discharge. Please follow up.   2. Diabetes: Mark Kirby has uncontrolled diabetes, with an HbA1c of 12%. He is currently on lantus 20 units QHS, novolog 10 units TID w/ meals, and metformin. Alcohol cessation encouraged.    3. Healthcare access: Mark Kirby is currently uninsured and has intermittent  access to housing. He has inconsistent access to his medications and is not always able to refrigerate his insulin. Mark Kirby was given a 1 month supply of medications to bridge him to an outpatient setting, but will require significant support as he begins to better manage his medical conditions.  4.  Labs / imaging needed at time of follow-up: none  5.  Pending labs/ test needing follow-up: none  Follow-up Appointments: Follow-up Kirby. Schedule an appointment as soon as possible for a visit.   Specialty:  Internal Medicine Why:  Follow up hospital appointment May 12, 2018 at 1 pm   Contact information: [place] [id number] [place] Hospital Course by problem list: 1. Alcoholic Ketosis: Mark Kirby was brought to the hospital by EMS after being found unconscious at a [employer]. Upon awakening, he was unable to recall what had happened, but remembers drinking more than normal. He usually drinks multiple 40oz beers daily. He denied nausea, vomiting, abdominal pain, headache, urinary or bowel incontinence, seizures, fevers/chills, or recent sickness. He denied history of alcohol withdrawal or withdrawal seizures. In the ED, he was found to have an alcohol level of 365, with ketoacidosis. He was given 3L 0.9% NaCl, thiamine, folate, and a multivitamin, and placed on the CIWA protocol. He had no withdrawal symptoms while in the hospital.   2. Hyperglycemia, DKA: In the  ED, Mark Kirby was found to have a CBG of 491, urine glucose >500, urine ketones, and a serum beta-hydroxybuterate of 0.63. His VBG was pH 7.4; pO2 67; pCO2 30;  HCO3 19. He was given IVFs, 7U Lantus qhs, subQ insulin, and his potassium was repleted. On 3/4, his CBG improved to 151. His symptoms were improved. He was switched from IVFs to oral rehydration. Upon interview, Mark Kirby stated that he was diagnosed with type 1 diabetes in  2014 but has had intermittent outpt f/u and insulin adherence due to financial difficulties. He follows with endocrinology but has not seen them since April of 2019. The last time he took any insulin was Oct 2019. He takes metformin daily and has a glucometer at home. He was provided with a match letter and discharged with a 1 month supply of his home insulin/metformin regimen. Instructed to follow up with community health & wellness.  Discharge Vitals:   BP (!) 145/88 (BP Location: Right Arm)   Pulse 83   Temp 98.6 F (37 C) (Oral)   Resp 18   Ht _0  (1.702 m)   Wt 85.6 kg   SpO2 98%   BMI 29.56 kg/m   Pertinent Labs, Studies, and Procedures:  CBC Latest Ref Rng & Units 05/03/2018 05/03/2018 09/18/2015  WBC 4.0 - 10.5 K/uL - 5.1 12.1(H)  Hemoglobin 13.0 - 17.0 g/dL 14.6 14.0 15.1  Hematocrit 39.0 - 52.0 % 43.0 40.9 43.6  Platelets 150 - 400 K/uL - 213 442(H)   CMP Latest Ref Rng & Units 05/04/2018 05/03/2018 05/03/2018  Glucose 70 - 99 mg/dL 124(H) 330(H) -  BUN 6 - 20 mg/dL 5(L) 5(L) -  Creatinine 0.61 - 1.24 mg/dL 0.71 0.68 -  Sodium 135 - 145 mmol/L 139 138 135  Potassium 3.5 - 5.1 mmol/L 3.2(L) 3.3(L) 3.4(L)  Chloride 98 - 111 mmol/L 107 107 -  CO2 22 - 32 mmol/L 22 20(L) -  Calcium 8.9 - 10.3 mg/dL 7.7(L) 7.8(L) -  Total Protein 6.5 - 8.1 g/dL - - -  Total Bilirubin 0.3 - 1.2 mg/dL - - -  Alkaline Phos 38 - 126 U/L - - -  AST 15 - 41 U/L - - -  ALT 0 - 44 U/L - - -   Urinalysis    Component Value Date/Time   COLORURINE STRAW (A) 05/03/2018 2232   APPEARANCEUR CLEAR 05/03/2018 2232   LABSPEC 1.026 05/03/2018 2232   PHURINE 6.0 05/03/2018 2232   GLUCOSEU >=500 (A) 05/03/2018 2232   HGBUR NEGATIVE 05/03/2018 2232   BILIRUBINUR NEGATIVE 05/03/2018 2232   KETONESUR 5 (A) 05/03/2018 2232   PROTEINUR NEGATIVE 05/03/2018 2232   UROBILINOGEN 0.2 10/17/2012 1533   NITRITE NEGATIVE 05/03/2018 2232   LEUKOCYTESUR NEGATIVE 05/03/2018 2232   Venous Blood Gas result:  pO2 67; pCO2  30; pH 7.4;  HCO3 19 Alcohol: 365 Beta-hydroxy: 0.63 Troponin: 0.01  EKG: Sinus tachycardia 107 bpm   Discharge Instructions: Discharge Instructions    Call MD for:  difficulty breathing, headache or visual disturbances   Complete by:  As directed    Call MD for:  extreme fatigue   Complete by:  As directed    Call MD for:  persistant dizziness or light-headedness   Complete by:  As directed    Call MD for:  persistant nausea and vomiting   Complete by:  As directed    Diet - low sodium heart healthy   Complete by:  As directed  Discharge instructions   Complete by:  As directed    Mr. Gladu,  It was a pleasure to meet you. I am glad you are feeling better. Please continue to take all of your diabetes medications as previously prescribed.   For your alcohol use, we strongly recommend you stop drinking for your health. We have given you a prescription for naltrexone to help decrease cravings. Please take this once a day.  It is important that your find a primary care physician to help you. Please make an appointment as soon as possible. Please also schedule an appointment with your endocrinologist.   Thank you!  Dr. Philipp Ovens   Increase activity slowly   Complete by:  As directed       Signed: Velna Ochs, MD 05/11/2018, 3:38 PM   Pager: [phone]

## 2018-05-12 ENCOUNTER — Ambulatory Visit: Payer: Self-pay | Admitting: Family Medicine

## 2018-05-19 ENCOUNTER — Ambulatory Visit: Payer: Self-pay | Admitting: Family Medicine

## 2019-04-01 ENCOUNTER — Other Ambulatory Visit: Payer: Self-pay

## 2019-04-01 ENCOUNTER — Encounter (HOSPITAL_COMMUNITY): Payer: Self-pay | Admitting: Emergency Medicine

## 2019-04-01 ENCOUNTER — Emergency Department (HOSPITAL_COMMUNITY)
Admission: EM | Admit: 2019-04-01 | Discharge: 2019-04-01 | Disposition: A | Discharge status: Home / Self Care | Payer: 59 | Attending: Emergency Medicine | Admitting: Emergency Medicine

## 2019-04-01 DIAGNOSIS — Z79899 Other long term (current) drug therapy: Secondary | ICD-10-CM | POA: Diagnosis not present

## 2019-04-01 DIAGNOSIS — Z794 Long term (current) use of insulin: Secondary | ICD-10-CM | POA: Insufficient documentation

## 2019-04-01 DIAGNOSIS — E876 Hypokalemia: Secondary | ICD-10-CM | POA: Insufficient documentation

## 2019-04-01 DIAGNOSIS — E1065 Type 1 diabetes mellitus with hyperglycemia: Secondary | ICD-10-CM | POA: Insufficient documentation

## 2019-04-01 DIAGNOSIS — R739 Hyperglycemia, unspecified: Secondary | ICD-10-CM | POA: Diagnosis present

## 2019-04-01 DIAGNOSIS — Z20822 Contact with and (suspected) exposure to covid-19: Secondary | ICD-10-CM | POA: Diagnosis not present

## 2019-04-01 LAB — POCT I-STAT EG7
Acid-Base Excess: 1 mmol/L (ref 0.0–2.0)
Bicarbonate: 26.3 mmol/L (ref 20.0–28.0)
Calcium, Ion: 1.16 mmol/L (ref 1.15–1.40)
HCT: 41 % (ref 39.0–52.0)
Hemoglobin: 13.9 g/dL (ref 13.0–17.0)
O2 Saturation: 82 %
Potassium: 3.6 mmol/L (ref 3.5–5.1)
Sodium: 128 mmol/L — ABNORMAL LOW (ref 135–145)
TCO2: 28 mmol/L (ref 22–32)
pCO2, Ven: 45.1 mmHg (ref 44.0–60.0)
pH, Ven: 7.373 (ref 7.250–7.430)
pO2, Ven: 48 mmHg — ABNORMAL HIGH (ref 32.0–45.0)

## 2019-04-01 LAB — CBC
HCT: 40 % (ref 39.0–52.0)
Hemoglobin: 14.1 g/dL (ref 13.0–17.0)
MCH: 36 pg — ABNORMAL HIGH (ref 26.0–34.0)
MCHC: 35.3 g/dL (ref 30.0–36.0)
MCV: 102 fL — ABNORMAL HIGH (ref 80.0–100.0)
Platelets: 195 10*3/uL (ref 150–400)
RBC: 3.92 MIL/uL — ABNORMAL LOW (ref 4.22–5.81)
RDW: 11 % — ABNORMAL LOW (ref 11.5–15.5)
WBC: 5.9 10*3/uL (ref 4.0–10.5)
nRBC: 0 % (ref 0.0–0.2)

## 2019-04-01 LAB — CBG MONITORING, ED
Glucose-Capillary: 468 mg/dL — ABNORMAL HIGH (ref 70–99)
Glucose-Capillary: 291 mg/dL — ABNORMAL HIGH (ref 70–99)
Glucose-Capillary: 170 mg/dL — ABNORMAL HIGH (ref 70–99)

## 2019-04-01 LAB — BASIC METABOLIC PANEL
Anion gap: 21 — ABNORMAL HIGH (ref 5–15)
BUN: 6 mg/dL (ref 6–20)
CO2: 23 mmol/L (ref 22–32)
Calcium: 10 mg/dL (ref 8.9–10.3)
Chloride: 83 mmol/L — ABNORMAL LOW (ref 98–111)
Creatinine, Ser: 0.9 mg/dL (ref 0.61–1.24)
GFR calc Af Amer: >60 mL/min (ref >60)
GFR calc non Af Amer: >60 mL/min (ref >60)
Glucose, Bld: 492 mg/dL — ABNORMAL HIGH (ref 70–99)
Potassium: 3.5 mmol/L (ref 3.5–5.1)
Sodium: 127 mmol/L — ABNORMAL LOW (ref 135–145)

## 2019-04-01 LAB — I-STAT CHEM 8, ED
BUN: 4 mg/dL — ABNORMAL LOW (ref 6–20)
Calcium, Ion: 1.11 mmol/L — ABNORMAL LOW (ref 1.15–1.40)
Chloride: 92 mmol/L — ABNORMAL LOW (ref 98–111)
Creatinine, Ser: 0.6 mg/dL — ABNORMAL LOW (ref 0.61–1.24)
Glucose, Bld: 179 mg/dL — ABNORMAL HIGH (ref 70–99)
HCT: 38 % — ABNORMAL LOW (ref 39.0–52.0)
Hemoglobin: 12.9 g/dL — ABNORMAL LOW (ref 13.0–17.0)
Potassium: 3.2 mmol/L — ABNORMAL LOW (ref 3.5–5.1)
Sodium: 133 mmol/L — ABNORMAL LOW (ref 135–145)
TCO2: 28 mmol/L (ref 22–32)

## 2019-04-01 LAB — URINALYSIS, ROUTINE W REFLEX MICROSCOPIC
Bacteria, UA: NONE SEEN
Bilirubin Urine: NEGATIVE
Glucose, UA: >=500 mg/dL — AB
Hgb urine dipstick: NEGATIVE
Ketones, ur: 5 mg/dL — AB
Leukocytes,Ua: NEGATIVE
Nitrite: NEGATIVE
Protein, ur: NEGATIVE mg/dL
Specific Gravity, Urine: 1.015 (ref 1.005–1.030)
pH: 6 (ref 5.0–8.0)

## 2019-04-01 MED ORDER — SODIUM CHLORIDE 0.9 % IV BOLUS
1000.0000 mL | Freq: Once | INTRAVENOUS | Status: AC
  Administered 2019-04-01: 1000 mL via INTRAVENOUS

## 2019-04-01 MED ORDER — POTASSIUM CHLORIDE ER 10 MEQ PO TBCR: 10.0000 meq | EXTENDED_RELEASE_TABLET | Freq: Every day | ORAL | 0 refills | Status: DC

## 2019-04-01 MED ORDER — POTASSIUM CHLORIDE CRYS ER 20 MEQ PO TBCR
40.0000 meq | EXTENDED_RELEASE_TABLET | Freq: Once | ORAL | Status: AC
  Administered 2019-04-01: 20:00:00 40 meq via ORAL
  Filled 2019-04-01: qty 2

## 2019-04-01 MED ORDER — SODIUM CHLORIDE 0.9 % IV BOLUS
1000.0000 mL | Freq: Once | INTRAVENOUS | Status: AC
  Administered 2019-04-01: 16:00:00 1000 mL via INTRAVENOUS

## 2019-04-01 MED ORDER — METFORMIN HCL 500 MG PO TABS
1000.0000 mg | ORAL_TABLET | Freq: Once | ORAL | Status: AC
  Administered 2019-04-01: 16:00:00 1000 mg via ORAL
  Filled 2019-04-01: qty 2

## 2019-04-01 MED ORDER — INSULIN ASPART 100 UNIT/ML ~~LOC~~ SOLN
4.0000 [IU] | Freq: Once | SUBCUTANEOUS | Status: AC
  Administered 2019-04-01: 18:00:00 4 [IU] via SUBCUTANEOUS

## 2019-04-01 NOTE — ED Provider Notes (Signed)
Care assumed from [facility], [place] at shift change pending labs. See his note for full H&P.  Per his note, "Mark Kirby is a 54 y.o. male history of diabetes, alcohol use, pancreatitis.  Currently on Metformin and Lantus.  Patient presents today for concern of hyperglycemia.  He reports that he has had "a bad week".  He reports that his blood glucose has been in the 400s throughout the entire week and he has had difficulty controlling them at home.  He reports that he is recently lost contact with his endocrinologist in [facility] and has not yet established one here in [place].  He denies any symptoms at this time, denies nausea/vomiting, abdominal pain, polydipsia polyuria or any additional concerns.  He reports that he has been feeling overall well this week denies any recent infectious-like symptoms or injuries.  He denies any pain at this time reports he is otherwise feeling well."   Physical Exam  BP (!) 157/94   Pulse 96   Temp 97.9 F (36.6 C) (Oral)   Resp 15   Ht 5\' 7"  (1.702 m)   Wt 79.4 kg   SpO2 97%   BMI 27.41 kg/m   Physical Exam Vitals and nursing note reviewed.  Constitutional:      Appearance: He is well-developed.  HENT:     Head: Normocephalic and atraumatic.     Mouth/Throat:     Mouth: Mucous membranes are dry.  Eyes:     Conjunctiva/sclera: Conjunctivae normal.  Cardiovascular:     Rate and Rhythm: Normal rate and regular rhythm.     Pulses: Normal pulses.     Heart sounds: Normal heart sounds. No murmur.  Pulmonary:     Effort: Pulmonary effort is normal. No respiratory distress.     Breath sounds: Normal breath sounds. No wheezing, rhonchi or rales.  Abdominal:     Palpations: Abdomen is soft.     Tenderness: There is no abdominal tenderness.  Musculoskeletal:     Cervical back: Neck supple.     Right lower leg: No edema.     Left lower leg: No edema.  Skin:    General: Skin is warm and dry.  Neurological:     Mental Status: He  is alert.       ED Course/Procedures     Procedures  Results for orders placed or performed during the hospital encounter of 04/01/19  Basic metabolic panel  Result Value Ref Range   Sodium 127 (L) 135 - 145 mmol/L   Potassium 3.5 3.5 - 5.1 mmol/L   Chloride 83 (L) 98 - 111 mmol/L   CO2 23 22 - 32 mmol/L   Glucose, Bld 492 (H) 70 - 99 mg/dL   BUN 6 6 - 20 mg/dL   Creatinine, Ser 04/03/19 0.61 - 1.24 mg/dL   Calcium 5.05 8.9 - 39.7 mg/dL   GFR calc non Af Amer >60 >60 mL/min   GFR calc Af Amer >60 >60 mL/min   Anion gap 21 (H) 5 - 15  CBC  Result Value Ref Range   WBC 5.9 4.0 - 10.5 K/uL   RBC 3.92 (L) 4.22 - 5.81 MIL/uL   Hemoglobin 14.1 13.0 - 17.0 g/dL   HCT 67.3 41.9 - 37.9 %   MCV 102.0 (H) 80.0 - 100.0 fL   MCH 36.0 (H) 26.0 - 34.0 pg   MCHC 35.3 30.0 - 36.0 g/dL   RDW 02.4 (L) 09.7 - 35.3 %   Platelets 195 150 -  400 K/uL   nRBC 0.0 0.0 - 0.2 %  Urinalysis, Routine w reflex microscopic  Result Value Ref Range   Color, Urine STRAW (A) YELLOW   APPearance CLEAR CLEAR   Specific Gravity, Urine 1.015 1.005 - 1.030   pH 6.0 5.0 - 8.0   Glucose, UA >=500 (A) NEGATIVE mg/dL   Hgb urine dipstick NEGATIVE NEGATIVE   Bilirubin Urine NEGATIVE NEGATIVE   Ketones, ur 5 (A) NEGATIVE mg/dL   Protein, ur NEGATIVE NEGATIVE mg/dL   Nitrite NEGATIVE NEGATIVE   Leukocytes,Ua NEGATIVE NEGATIVE   RBC / HPF 0-5 0 - 5 RBC/hpf   Bacteria, UA NONE SEEN NONE SEEN  CBG monitoring, ED  Result Value Ref Range   Glucose-Capillary 468 (H) 70 - 99 mg/dL  POCT I-Stat EG7  Result Value Ref Range   pH, Ven 7.373 7.250 - 7.430   pCO2, Ven 45.1 44.0 - 60.0 mmHg   pO2, Ven 48.0 (H) 32.0 - 45.0 mmHg   Bicarbonate 26.3 20.0 - 28.0 mmol/L   TCO2 28 22 - 32 mmol/L   O2 Saturation 82.0 %   Acid-Base Excess 1.0 0.0 - 2.0 mmol/L   Sodium 128 (L) 135 - 145 mmol/L   Potassium 3.6 3.5 - 5.1 mmol/L   Calcium, Ion 1.16 1.15 - 1.40 mmol/L   HCT 41.0 39.0 - 52.0 %   Hemoglobin 13.9 13.0 - 17.0 g/dL    Patient temperature HIDE    Sample type VENOUS   POC CBG, ED  Result Value Ref Range   Glucose-Capillary 291 (H) 70 - 99 mg/dL  POC CBG, ED  Result Value Ref Range   Glucose-Capillary 170 (H) 70 - 99 mg/dL  I-stat chem 8, ED (not at [facility] or [facility])  Result Value Ref Range   Sodium 133 (L) 135 - 145 mmol/L   Potassium 3.2 (L) 3.5 - 5.1 mmol/L   Chloride 92 (L) 98 - 111 mmol/L   BUN 4 (L) 6 - 20 mg/dL   Creatinine, Ser 0.60 (L) 0.61 - 1.24 mg/dL   Glucose, Bld 179 (H) 70 - 99 mg/dL   Calcium, Ion 1.11 (L) 1.15 - 1.40 mmol/L   TCO2 28 22 - 32 mmol/L   Hemoglobin 12.9 (L) 13.0 - 17.0 g/dL   HCT 38.0 (L) 39.0 - 52.0 %   No results found.  EKG Interpretation  Date/Time:  Saturday April 01 2019 16:24:40 EST Ventricular Rate:  80 PR Interval:    QRS Duration: 80 QT Interval:  389 QTC Calculation: 449 R Axis:   25 Text Interpretation: Age not entered, assumed to be  54 years old for purpose of ECG interpretation Sinus rhythm Anteroseptal infarct, old early repolarization seen, Poor R wave progression similar to prior Confirmed by Noemi Chapel [phone]) on 04/01/2019 4:46:36 PM        MDM   54 y/o M with a h/o DM and DKA presenting for eval of hyperglycemia for the last week. He denies infectious sxs, and is only complaining of fatigue. Compliant with lantus but states he has only been taking 500mg  of his metformin instead of 1000mg .  Initial CBG 468 CBC with no leukocytosis, anemia BMP with hyponatremia and hypochloremia, elevated blood glucose but normal bicarb. Anion gap is elevated at 21.  UA with glucosuria and mild ketonuria suspect from dehydration VBG with normal bicarb and normal pH.  EKG is unchanged from prior.  Patient was given 2 L of IV fluids, 1000 g of Metformin and 4 units of insulin.  His blood sugar improved to 170.  Repeat i-STAT Chem-8 showed mild hypokalemia which is supplemented in the ED and he was also given prescriptions for home.  His anion gap is now  closed at 13.  He does not appear to be in DKA at this time.  I feel that he is appropriate for discharge.  I advised him to increase his Metformin to 1000 mg daily and to continue to monitor his blood sugars closely.  I did test him for Covid given he was fatigued earlier this week.  Results are pending at discharge.  He was advised on quarantine measures.  He will be given information to follow-up with PCP in regards to his diabetes and other medical problems.  He was given strict instructions for any new or worsening symptoms.  He voiced understanding the plan and reasons to return.  All questions answered.  Patient stable for discharge.  ----  Mark Kirby was evaluated in Emergency Department on 04/01/2019 for the symptoms described in the history of present illness. He was evaluated in the context of the global COVID-19 pandemic, which necessitated consideration that the patient might be at risk for infection with the SARS-CoV-2 virus that causes COVID-19. Institutional protocols and algorithms that pertain to the evaluation of patients at risk for COVID-19 are in a state of rapid change based on information released by regulatory bodies including the CDC and federal and state organizations. These policies and algorithms were followed during the patient's care in the ED.     Rayne Du 04/01/19 2045    Eber Hong, MD 04/02/19 [phone]

## 2019-04-01 NOTE — ED Triage Notes (Signed)
Pt. Stated, My sugar has been high all weak , I think they call it DKA. Im just need to get it right \. I don't have a Dr. Gwynneth Aliment.

## 2019-04-01 NOTE — ED Provider Notes (Signed)
[facility] EMERGENCY DEPARTMENT Provider Note   CSN: [id number] Arrival date & time: 04/01/19  1406     History Chief Complaint  Patient presents with  . Hyperglycemia    Mark Kirby is a 54 y.o. male history of diabetes, alcohol use, pancreatitis.  Currently on Metformin and Lantus.  Patient presents today for concern of hyperglycemia.  He reports that he has had "a bad week".  He reports that his blood glucose has been in the 400s throughout the entire week and he has had difficulty controlling them at home.  He reports that he is recently lost contact with his endocrinologist in [facility] and has not yet established one here in [place].  He denies any symptoms at this time, denies nausea/vomiting, abdominal pain, polydipsia polyuria or any additional concerns.  He reports that he has been feeling overall well this week denies any recent infectious-like symptoms or injuries.  He denies any pain at this time reports he is otherwise feeling well. HPI     Past Medical History:  Diagnosis Date  . DKA (diabetic ketoacidoses) ([place]) 10/17/2012  . Pancreatitis   . Seasonal allergies     Patient Active Problem List   Diagnosis Date Noted  . Alcoholic ketoacidosis 62/94/7654  . Hyperglycemia 05/03/2018  . Diabetes mellitus type 1.5 ([place]) 09/18/2015  . SOB (shortness of breath)   . Diabetes mellitus, new onset ([place]) 10/18/2012  . Alcohol use disorder 10/18/2012  . DKA (diabetic ketoacidoses) ([place]) 10/17/2012  . Dehydration 10/17/2012  . Pancreatitis, acute 10/17/2012  . Nausea with vomiting 10/17/2012    Past Surgical History:  Procedure Laterality Date  . NO PAST SURGERIES         Family History  Problem Relation Age of Onset  . Diabetes Cousin   . Diabetes Other        Uncle    Social History   Tobacco Use  . Smoking status: Never Smoker  . Smokeless tobacco: Never Used  Substance Use Topics  . Alcohol use: Yes    Alcohol/week:  0.0 standard drinks    Comment: currently in treatment  . Drug use: No    Comment: hx    Home Medications Prior to Admission medications   Medication Sig Start Date End Date Taking? Authorizing Provider  glucose blood test strip Use as instructed Patient not taking: Reported on 05/03/2018 10/19/12   Samella Parr, NP  glucose monitoring kit (FREESTYLE) monitoring kit 1 each by Does not apply route as needed for other. Or any available-please ensure receives appropriate test strips and lancets Patient not taking: Reported on 05/03/2018 10/20/12   Samella Parr, NP  ibuprofen (ADVIL,MOTRIN) 200 MG tablet Take 400 mg by mouth every 4 (four) hours as needed (pain).    [provider]  insulin aspart (NOVOLOG) 100 UNIT/ML injection Inject 10 Units into the skin 3 (three) times daily with meals. 05/04/18   Velna Ochs, MD  insulin glargine (LANTUS) 100 UNIT/ML injection Inject 0.2 mLs (20 Units total) into the skin at bedtime. 05/04/18   Velna Ochs, MD  Insulin Syringes, Disposable, U-100 1 ML MISC Use to give your prescribed insulin Patient not taking: Reported on 05/03/2018 10/19/12   Samella Parr, NP  metFORMIN (GLUCOPHAGE) 1000 MG tablet Take 1 tablet (1,000 mg total) by mouth 2 (two) times daily with a meal. 05/04/18 05/04/19  Velna Ochs, MD  Multiple Vitamin (MULTIVITAMIN WITH MINERALS) TABS tablet Take 1 tablet by mouth daily.  [provider]  naltrexone (DEPADE) 50 MG tablet Take 1 tablet (50 mg total) by mouth daily. 05/04/18   Velna Ochs, MD    Allergies    Patient has no known allergies.  Review of Systems   Review of Systems Ten systems are reviewed and are negative for acute change except as noted in the HPI  Physical Exam Updated Vital Signs BP (!) 163/102 (BP Location: Left Arm)   Pulse 94   Temp 97.9 F (36.6 C) (Oral)   Resp 17   Ht _0  (1.702 m)   Wt 79.4 kg   SpO2 99%   BMI 27.41 kg/m   Physical Exam Constitutional:       General: He is not in acute distress.    Appearance: Normal appearance. He is well-developed. He is not ill-appearing or diaphoretic.  HENT:     Head: Normocephalic and atraumatic.     Right Ear: External ear normal.     Left Ear: External ear normal.     Nose: Nose normal.  Eyes:     General: Vision grossly intact. Gaze aligned appropriately.     Pupils: Pupils are equal, round, and reactive to light.  Neck:     Trachea: Trachea and phonation normal. No tracheal deviation.  Pulmonary:     Effort: Pulmonary effort is normal. No respiratory distress.  Abdominal:     General: There is no distension.     Palpations: Abdomen is soft.     Tenderness: There is no abdominal tenderness. There is no guarding or rebound.  Musculoskeletal:        General: Normal range of motion.     Cervical back: Normal range of motion.  Skin:    General: Skin is warm and dry.  Neurological:     Mental Status: He is alert.     GCS: GCS eye subscore is 4. GCS verbal subscore is 5. GCS motor subscore is 6.     Comments: Speech is clear and goal oriented, follows commands Major Cranial nerves without deficit, no facial droop Moves extremities without ataxia, coordination intact  Psychiatric:        Behavior: Behavior normal.     ED Results / Procedures / Treatments   Labs (all labs ordered are listed, but only abnormal results are displayed) Labs Reviewed  CBG MONITORING, ED - Abnormal; Notable for the following components:      Result Value   Glucose-Capillary 468 (*)    All other components within normal limits  BASIC METABOLIC PANEL  CBC  URINALYSIS, ROUTINE W REFLEX MICROSCOPIC    EKG None  Radiology No results found.  Procedures Procedures (including critical care time)  Medications Ordered in ED Medications - No data to display  ED Course  I have reviewed the triage vital signs and the nursing notes.  Pertinent labs & imaging results that were available during my care of the  patient were reviewed by me and considered in my medical decision making (see chart for details).    MDM Rules/Calculators/A&P                     54 year old diabetic male on Metformin and Lantus arrives today with concern of hyperglycemia for the past week with blood sugars consistently in the 400s at home.  He has had difficulty controlling his blood sugar and has lost touch with his endocrinologist over the past year and [place].  He denies any pain, nausea/vomiting or recent infectious-like  symptoms.  He reports that he is overall feeling well at this time but wants to avoid DKA as he has had in the past.  CBG on arrival is 468.  Remainder of blood work is pending.  Will give fluid bolus empirically.  Care handoff given to Sharon PA-C at shift change.  Plan of care is to follow-up on pending lab work, reassess.  Disposition per oncoming team.  Note: Portions of this report may have been transcribed using voice recognition software. Every effort was made to ensure accuracy; however, inadvertent computerized transcription errors may still be present. Final Clinical Impression(s) / ED Diagnoses Final diagnoses:  None    Rx / DC Orders ED Discharge Orders    None       Gari Crown 04/01/19 1501    Pattricia Boss, MD 04/01/19 1505

## 2019-04-01 NOTE — Discharge Instructions (Addendum)
Your potassium was noted to be slightly low on your laboratory work today.  You were given potassium in the ED and also a prescription for short course of potassium supplementation for home.  You were tested for the coronavirus today.  The results will be available in the next 1 to 2 days.  If the results are positive then you will need to quarantine for at least 7 days since the onset of your symptoms AND >72 hours after symptoms resolution (absence of fever without the use of fever reducing medicaiton and improvement in respiratory symptoms), whichever is longer  Please increase your Metformin to 1000 mg twice daily to help control your blood sugars more.  You were given information to follow-up with your regular doctor at the [facility] health and wellness clinic.  Please call the office schedule an appointment for follow-up.  Please monitor symptoms closely and return to the ED for new or worsening symptoms.

## 2019-04-01 NOTE — ED Notes (Signed)
Pt verbalized understanding of discharge instructions. Follow up care and medications reviewed. Pt had no further questions at this time.

## 2019-04-03 LAB — NOVEL CORONAVIRUS, NAA (HOSP ORDER, SEND-OUT TO REF LAB; TAT 18-24 HRS): SARS-CoV-2, NAA: NOT DETECTED

## 2019-04-13 ENCOUNTER — Ambulatory Visit: Payer: 59

## 2019-05-25 ENCOUNTER — Ambulatory Visit: Payer: 59 | Admitting: Family Medicine

## 2019-06-20 ENCOUNTER — Encounter: Payer: Self-pay | Admitting: Internal Medicine

## 2019-06-20 ENCOUNTER — Ambulatory Visit: Payer: 59 | Attending: Internal Medicine | Admitting: Internal Medicine

## 2019-06-20 ENCOUNTER — Other Ambulatory Visit: Payer: Self-pay

## 2019-06-20 DIAGNOSIS — Z1211 Encounter for screening for malignant neoplasm of colon: Secondary | ICD-10-CM

## 2019-06-20 DIAGNOSIS — E559 Vitamin D deficiency, unspecified: Secondary | ICD-10-CM

## 2019-06-20 DIAGNOSIS — D649 Anemia, unspecified: Secondary | ICD-10-CM | POA: Diagnosis not present

## 2019-06-20 DIAGNOSIS — Z1331 Encounter for screening for depression: Secondary | ICD-10-CM

## 2019-06-20 DIAGNOSIS — E139 Other specified diabetes mellitus without complications: Secondary | ICD-10-CM

## 2019-06-20 MED ORDER — METFORMIN HCL 1000 MG PO TABS: 1000.0000 mg | ORAL_TABLET | Freq: Every day | ORAL | 0 refills | Status: DC

## 2019-06-20 MED ORDER — INSULIN GLARGINE 100 UNIT/ML ~~LOC~~ SOLN: 24.0000 [IU] | Freq: Every day | SUBCUTANEOUS | 3 refills | Status: DC

## 2019-06-20 NOTE — Progress Notes (Signed)
Virtual Visit via Telephone Note Due to current restrictions/limitations of in-office visits due to the COVID-19 pandemic, this scheduled clinical appointment was converted to a telehealth visit  I connected with Mark Kirby on 06/20/19 at 11:37a.m by telephone and verified that I am speaking with the correct person using two identifiers. I am in my office.  The patient is at home.  Only the patient and myself participated in this encounter.  I discussed the limitations, risks, security and privacy concerns of performing an evaluation and management service by telephone and the availability of in person appointments. I also discussed with the patient that there may be a patient responsible charge related to this service. The patient expressed understanding and agreed to proceed.   History of Present Illness: This is a new patient visit to establish care. Patient with history of DM type I, EtOH use disorder, pancreatitis acute.  Pt has no prior PCP. He was seeing endocrinologist Dr. Alanson Aly at [place] saw her 2019. Not seen since then due to lack of insurance at the time and COVID pandemic.  He would like to get established with an endocrinologist here in [place] Last A1C was 05/2018, it was 12.  Patient tells me when he was first diagnosed several years ago his A1c was 17. Checking BS once a day in mornings.  Gives range 150-250. Occasional low BS.  Currently on Metformin 1 gram daily (but suppose to be on 1 gram BID) and Lantus 20 units daily.  Metformin makes him gassy so he only takes 1 g daily.  Taking meds consistently for past few mths. Last eye:  Exam yrs ago. Blurred vision some times.  Agreeable to referral for eye exam.  He would like to go to a specific practice but he does not recall the name.  He states he will call us back with this information so that I can submit the referral No numbness in hands and feet. Polyuria sometimes. No polydipsia. Saw RD  in past. Does okay with food choices but meal times not consistent. Walking almost daily.    Would like to know about getting a continuous blood sugar monitor. He tells me that [employer] offers a diabetic program call Level 2.  He states he would like to join that program but when he called they told him that he was not in the system.  He also states that his current employer requires biometrics but he is not sure what all is requested for the biometrics.  He thinks it involves blood pressure check and blood tests.  He will find out more details and let us know.  He gives history of vitamin D deficiency.  However, currently not on vitamin D supplement.  HM: Overdue for eye exam. Due for colon cancer screening.  Gives family history of colon cancer in his paternal grandfather and uncles on both sides of the family.   Outpatient Encounter Medications as of 06/20/2019  Medication Sig  . insulin glargine (LANTUS) 100 UNIT/ML injection Inject 0.2 mLs (20 Units total) into the skin at bedtime.  . metFORMIN (GLUCOPHAGE) 1000 MG tablet Take 1 tablet (1,000 mg total) by mouth 2 (two) times daily with a meal.  . Multiple Vitamin (MULTIVITAMIN WITH MINERALS) TABS tablet Take 1 tablet by mouth daily.  Marland Kitchen glucose blood test strip Use as instructed (Patient not taking: Reported on 05/03/2018)  . glucose monitoring kit (FREESTYLE) monitoring kit 1 each by Does not apply route as needed  for other. Or any available-please ensure receives appropriate test strips and lancets (Patient not taking: Reported on 05/03/2018)  . ibuprofen (ADVIL,MOTRIN) 200 MG tablet Take 400 mg by mouth every 4 (four) hours as needed (pain).  . insulin aspart (NOVOLOG) 100 UNIT/ML injection Inject 10 Units into the skin 3 (three) times daily with meals. (Patient not taking: Reported on 06/20/2019)  . Insulin Syringes, Disposable, U-100 1 ML MISC Use to give your prescribed insulin (Patient not taking: Reported on 05/03/2018)  .  naltrexone (DEPADE) 50 MG tablet Take 1 tablet (50 mg total) by mouth daily.  . potassium chloride (KLOR-CON) 10 MEQ tablet Take 1 tablet (10 mEq total) by mouth daily for 4 days.   No facility-administered encounter medications on file as of 06/20/2019.     Observations/Objective: Lab Results  Component Value Date   WBC 5.9 04/01/2019   HGB 12.9 (L) 04/01/2019   HCT 38.0 (L) 04/01/2019   MCV 102.0 (H) 04/01/2019   PLT 195 04/01/2019     Chemistry      Component Value Date/Time   NA 133 (L) 04/01/2019 2003   K 3.2 (L) 04/01/2019 2003   CL 92 (L) 04/01/2019 2003   CO2 23 04/01/2019 1442   BUN 4 (L) 04/01/2019 2003   CREATININE 0.60 (L) 04/01/2019 2003      Component Value Date/Time   CALCIUM 10.0 04/01/2019 1442   ALKPHOS 79 05/03/2018 2048   AST 58 (H) 05/03/2018 2048   ALT 52 (H) 05/03/2018 2048   BILITOT 0.5 05/03/2018 2048     Lab Results  Component Value Date   HGBA1C 12.0 (H) 05/03/2018   Depression screen PHQ 2/9 06/20/2019  Decreased Interest 0  Down, Depressed, Hopeless 3  PHQ - 2 Score 3  Altered sleeping 3  Tired, decreased energy 2  Change in appetite 2  Feeling bad or failure about yourself  1  Trouble concentrating 3  Moving slowly or fidgety/restless 0  Suicidal thoughts 0  PHQ-9 Score 14   GAD 7 : Generalized Anxiety Score 06/20/2019  Nervous, Anxious, on Edge 3  Control/stop worrying 2  Worry too much - different things 2  Trouble relaxing 2  Restless 1  Easily annoyed or irritable 0  Afraid - awful might happen 0  Total GAD 7 Score 10     Assessment and Plan: 1. Diabetes mellitus type 1.5 ([place]) Commended him on regular exercise.  Encouraged him to try to avoid skipping meals. Reported blood sugars not at goal.  Recommend increasing Lantus to 24 units daily with goal of morning blood sugars being 90-130.  Patient tells me he will "see about that" because he is different and his blood sugars have never been completely normal.  Continue  Metformin 1 g daily. -I will submit a referral for him to see the endocrinologist.  He will speak with the endocrinologist about getting continuous blood sugar monitor. -I also told him to check with his insurance in several days to see whether he can now be enrolled in there diabetic program now that he has establish care with a PCP. -He will call us back with the name of the ophthalmology practice where he wished to be referred to. - Ambulatory referral to Endocrinology - Lipid panel; Future - Microalbumin / creatinine urine ratio; Future - Hemoglobin A1c; Future - Hepatic Function Panel; Future  2. Normochromic anemia He had mild anemia on CBC done through the emergency room in January of this year.  This may have  been dilutional. - CBC; Future  3. Screening for colon cancer Discussed colon cancer screening methods.  He does not want to have colonoscopy.  He prefers to have the Cologuard. - Cologuard  4. Vitamin D deficiency - VITAMIN D 25 Hydroxy (Vit-D Deficiency, Fractures); Future  5. Positive depression screening This was positive but we did not get to discuss that on this visit today as patient had to start work.  We can discuss on follow-up visit.   Follow Up Instructions: 2 mths  He will come to the lab next week to have his blood test done.  I have requested that my CMA check his blood pressure, weight and waist circumference at that time as it may be required for his biometrics.  I discussed the assessment and treatment plan with the patient. The patient was provided an opportunity to ask questions and all were answered. The patient agreed with the plan and demonstrated an understanding of the instructions.   The patient was advised to call back or seek an in-person evaluation if the symptoms worsen or if the condition fails to improve as anticipated.  I provided 28 minutes of non-face-to-face time during this encounter.   Karle Plumber, MD

## 2019-06-20 NOTE — Progress Notes (Signed)
Pt states he is only taking 1 Metformin a day

## 2019-07-20 ENCOUNTER — Ambulatory Visit (HOSPITAL_COMMUNITY)
Admission: RE | Admit: 2019-07-20 | Discharge: 2019-07-20 | Disposition: A | Discharge status: Home / Self Care | Payer: 59 | Attending: Psychiatry | Admitting: Psychiatry

## 2019-07-20 ENCOUNTER — Telehealth (HOSPITAL_COMMUNITY): Payer: Self-pay | Admitting: Licensed Clinical Social Worker

## 2019-07-20 DIAGNOSIS — Z1331 Encounter for screening for depression: Secondary | ICD-10-CM | POA: Diagnosis not present

## 2019-07-20 DIAGNOSIS — F329 Major depressive disorder, single episode, unspecified: Secondary | ICD-10-CM | POA: Diagnosis present

## 2019-07-20 NOTE — BH Assessment (Addendum)
Assessment Note  Mark Kirby is an 54 y.o. male. Patient presents to [facility] as a walk-in (voluntary). Patient is a self referral. States with worsening depressive symptoms for the past 6 months. States that he works from home but has been out of work since April 30th. Patient with vegetative symptoms of laying around the home daily. Depressive symptoms include: Feeling angry/irritable, Feeling worthless/self pity, Loss of interest in usual pleasures, Guilt, Fatigue, Isolating. Patient with symptoms of mild anxiety. He does not identify any specific stressors. States that "life" is a stressors. He denies SI. No history of prior suicide attempts. No self mutilating behaviors. He has a family history of depression (mom's cousin"). Patient's appetite is poor. He reports sleeping 2-3 hours on a "good night". Otherwise, he doesn't sleep at all. No HI. No history of aggressive and/or assaultive behaviors. He denies AVH's. Patient does not appear to be responding to internal stimuli. Patient reports having friends that are his support system. He denies drug use. He does have a history of intermittent episodes of heavy alcohol use. No history of seizures and/or black outs. No history of DT's. No withdrawal symptoms reported. No history of inpatient psychiatric treatment. He does not have a therapist and/or psychiatrist. He has spoken to a therapist ("yrs ago), briefly after his father passed away 14 yrs ago.   Patient is oriented to time, person, place, and situation. Affect is depressed. Insight and judgement is fair. Impulse control is fair. Patient is dressed appropriately. Mood is depressed.   Diagnosis: Depressive Disorder  Past Medical History:  Past Medical History:  Diagnosis Date  . DKA (diabetic ketoacidoses) (HCC) 10/17/2012  . Pancreatitis   . Seasonal allergies     Past Surgical History:  Procedure Laterality Date  . NO PAST SURGERIES      Family History:  Family History  Problem  Relation Age of Onset  . Diabetes Cousin   . Diabetes Other        Uncle  . Colon cancer Paternal Uncle   . Colon cancer Paternal Grandfather     Social History:  reports that he has never smoked. He has never used smokeless tobacco. He reports current alcohol use. He reports that he does not use drugs.  Additional Social History:  Alcohol / Drug Use Pain Medications: SEE MAR Prescriptions: SEE MAR Over the Counter: SEE MAR History of alcohol / drug use?: Yes Longest period of sobriety (when/how long): unk Withdrawal Symptoms: (Patient with visible tremors.) Substance #1 Name of Substance 1: Alcohol 1 - Age of First Use: unk 1 - Amount (size/oz): "Gallon of liquor per day" 1 - Frequency: "It comes and goes"; "Sometimes I drink for 2 weeks straight and then I don't drink for 2 weeks" 1 - Duration: on-gong 1 - Last Use / Amount: 07/20/2019; "I didn't drink much"  CIWA: CIWA-Ar BP: 114/83 Pulse Rate: (!) 107([place] Designer, television/film set was notified) COWS:    Allergies: No Known Allergies  Home Medications: (Not in a hospital admission)   OB/GYN Status:  No LMP for male patient.  General Assessment Data Is this a Tele or Face-to-Face Assessment?: Face-to-Face Is this an Initial Assessment or a Re-assessment for this encounter?: Initial Assessment Patient Accompanied by:: (self referral ) Language Other than English: No Living Arrangements: (lives with housemate) What gender do you identify as?: Male Marital status: Single Maiden name: (n/a) Pregnancy Status: No Living Arrangements: Non-relatives/Friends(has a housemate) Can pt return to current living arrangement?: No Admission Status: Voluntary Is patient  capable of signing voluntary admission?: Yes Referral Source: Self/Family/Friend Insurance type: [facility])     Crisis Care Plan Living Arrangements: Non-relatives/Friends(has a housemate) Legal Guardian: (no legal guardian ) Name of Psychiatrist: (no psychiatrist ) Name of  Therapist: (no therapist )  Education Status Is patient currently in school?: No Is the patient employed, unemployed or receiving disability?: Employed  Risk to self with the past 6 months Suicidal Ideation: No Has patient been a risk to self within the past 6 months prior to admission? : No Suicidal Intent: No Has patient had any suicidal intent within the past 6 months prior to admission? : No Is patient at risk for suicide?: No Suicidal Plan?: No Has patient had any suicidal plan within the past 6 months prior to admission? : No Access to Means: No What has been your use of drugs/alcohol within the last 12 months?: (patient reports "on and off" alcohol use) Previous Attempts/Gestures: No How many times?: (0) Other Self Harm Risks: (n/a) Triggers for Past Attempts: (no past attempts) Intentional Self Injurious Behavior: None Family Suicide History: No Recent stressful life event(s): Other (Comment)("life") Persecutory voices/beliefs?: No Depression: Yes Depression Symptoms: Feeling angry/irritable, Feeling worthless/self pity, Loss of interest in usual pleasures, Guilt, Fatigue, Isolating Substance abuse history and/or treatment for substance abuse?: No Suicide prevention information given to non-admitted patients: Not applicable  Risk to Others within the past 6 months Homicidal Ideation: No Does patient have any lifetime risk of violence toward others beyond the six months prior to admission? : No Thoughts of Harm to Others: No Current Homicidal Intent: No Current Homicidal Plan: No Access to Homicidal Means: No Identified Victim: (n/a) History of harm to others?: No Assessment of Violence: None Noted Violent Behavior Description: (currently calm and cooeprative ) Does patient have access to weapons?: No Criminal Charges Pending?: No Does patient have a court date: No Is patient on probation?: No  Psychosis Hallucinations: None noted Delusions: None noted  Mental  Status Report Appearance/Hygiene: Disheveled Eye Contact: Good Motor Activity: Freedom of movement Speech: Logical/coherent Level of Consciousness: Alert Mood: Depressed Affect: Sad, Depressed Anxiety Level: None Thought Processes: Coherent, Relevant Judgement: Unimpaired Orientation: Person, Place, Situation, Time Obsessive Compulsive Thoughts/Behaviors: None  Cognitive Functioning Concentration: Decreased Memory: Recent Intact, Remote Intact Is patient IDD: No Insight: Good Impulse Control: Fair Appetite: Poor Have you had any weight changes? : Loss Amount of the weight change? (lbs): (unk) Sleep: Decreased Total Hours of Sleep: (2-3 hrs "on a good night"; "some nights I don't sleep") Vegetative Symptoms: Staying in bed  ADLScreening [facility]) Patient's cognitive ability adequate to safely complete daily activities?: Yes Patient able to express need for assistance with ADLs?: Yes Independently performs ADLs?: Yes (appropriate for developmental age)  Prior Inpatient Therapy Prior Inpatient Therapy: No  Prior Outpatient Therapy Prior Outpatient Therapy: No Does patient have an ACCT team?: No Does patient have Intensive In-House Services?  : No Does patient have [facility] services? : No Does patient have P4CC services?: No  ADL Screening (condition at time of admission) Patient's cognitive ability adequate to safely complete daily activities?: Yes Is the patient deaf or have difficulty hearing?: No Does the patient have difficulty seeing, even when wearing glasses/contacts?: No Does the patient have difficulty concentrating, remembering, or making decisions?: No Patient able to express need for assistance with ADLs?: Yes Does the patient have difficulty dressing or bathing?: No Independently performs ADLs?: Yes (appropriate for developmental age)       Abuse/Neglect Assessment (Assessment to  be complete while patient is alone) Physical Abuse:  Denies Verbal Abuse: Denies Sexual Abuse: Denies Exploitation of patient/patient's resources: Denies Self-Neglect: Denies     Regulatory affairs officer (For Healthcare) Does Patient Have a Medical Advance Directive?: No Nutrition Screen- [facility] Adult/WL/AP Patient's home diet: Regular        Disposition: Per Oletta Lamas, NP, patient psych cleared. Recommended to follow up with [facility] (psych IOP or CDIOP) Disposition Initial Assessment Completed for this Encounter: Yes Disposition of Patient: Discharge Mode of transportation if patient is discharged/movement?: Car Patient referred to: Outpatient clinic referral(Patient referred to [facility] for Psych IOP/CDIOP)  On Site Evaluation by:   Reviewed with Physician:    Waldon Merl 07/20/2019 12:54 PM

## 2019-07-20 NOTE — H&P (Signed)
Behavioral Health Medical Screening Exam  Mark Kirby is an 54 y.o. male.who presented to [facility] asa a walk-in, voluntarily. Patient endorsed depression stating he has felt more isolated and down for the last 6 months. When asked about triggers or stressors he stated," Just life in general. I feel lost. Im not seeing my kids as much as I want to." He denied current SI, HI or psychosis, self harming behaviorsor history thereof. He denied prior mental health history. Reported seeing a therapist in the past but denied having current therapy services. Denied have a current psychiatrist. Reported drinking alcohol which he described as," It just depend. Sometimes I will  Drink for two weeks straight and other times I wont drink for two weeks." He reported drinking beer and liquor but stated he drinks more liquor. He denied other substance abuse or use. Denied violent behaviors or criminal issues.   Total Time spent with patient: 15 minutes  Psychiatric Specialty Exam: Physical Exam  Vitals reviewed. Constitutional: He is oriented to person, place, and time.  Neurological: He is alert and oriented to person, place, and time.    Review of Systems  Psychiatric/Behavioral:       Depression     There were no vitals taken for this visit.There is no height or weight on file to calculate BMI.  General Appearance: Fairly Groomed  Eye Contact:  Fair  Speech:  Clear and Coherent and Normal Rate  Volume:  Normal  Mood:  Depressed  Affect:  Blunt  Thought Process:  Coherent, Linear and Descriptions of Associations: Intact  Orientation:  Full (Time, Place, and Person)  Thought Content:  Logical  Suicidal Thoughts:  No  Homicidal Thoughts:  No  Memory:  Immediate;   Fair Recent;   Fair  Judgement:  Fair  Insight:  Fair  Psychomotor Activity:  Normal  Concentration: Concentration: Fair and Attention Span: Fair  Recall:  [employer] of Knowledge:Fair  Language: Good  Akathisia:  Negative   Handed:  Right  AIMS (if indicated):     Assets:  Communication Skills Desire for Improvement Resilience  Sleep:       Musculoskeletal: Strength & Muscle Tone: within normal limits Gait & Station: normal Patient leans: N/A  There were no vitals taken for this visit.  Recommendations:  Based on my evaluation the patient does not appear to have an emergency medical condition.  Patient denied SI, HI and psychosis. He did however endorse symptoms of depression. There was no evidence of imminent risk to self or others at present.   Patient does not meet criteria for psychiatric inpatient admission. We discussed the IOP program  where he can see a therapist and speak to a psychiatric provider to discuss medications as appropriate  Patient he was open tpo services. He was provided with resoruces and strongly encourage to follow-up. He stated he did not feel like he had an issues with his alcohol use and declined the need for additional services.     Denzil Magnuson, NP 07/20/2019, 10:10 AM

## 2019-07-25 ENCOUNTER — Other Ambulatory Visit: Payer: Self-pay

## 2019-07-27 ENCOUNTER — Other Ambulatory Visit: Payer: Self-pay

## 2019-07-27 ENCOUNTER — Ambulatory Visit (INDEPENDENT_AMBULATORY_CARE_PROVIDER_SITE_OTHER): Payer: 59 | Admitting: Internal Medicine

## 2019-07-27 ENCOUNTER — Encounter: Payer: Self-pay | Admitting: Internal Medicine

## 2019-07-27 VITALS — BP 164/100 | HR 80 | Temp 98.7°F | Ht 67.44 in | Wt 171.4 lb

## 2019-07-27 DIAGNOSIS — Z794 Long term (current) use of insulin: Secondary | ICD-10-CM | POA: Diagnosis not present

## 2019-07-27 DIAGNOSIS — E1165 Type 2 diabetes mellitus with hyperglycemia: Secondary | ICD-10-CM | POA: Insufficient documentation

## 2019-07-27 DIAGNOSIS — E559 Vitamin D deficiency, unspecified: Secondary | ICD-10-CM | POA: Diagnosis not present

## 2019-07-27 DIAGNOSIS — E785 Hyperlipidemia, unspecified: Secondary | ICD-10-CM | POA: Insufficient documentation

## 2019-07-27 LAB — BASIC METABOLIC PANEL
BUN: 8 mg/dL (ref 6–23)
CO2: 27 mEq/L (ref 19–32)
Calcium: 9.4 mg/dL (ref 8.4–10.5)
Chloride: 90 mEq/L — ABNORMAL LOW (ref 96–112)
Creatinine, Ser: 0.78 mg/dL (ref 0.40–1.50)
GFR: 126.59 mL/min (ref >60.00)
Glucose, Bld: 338 mg/dL — ABNORMAL HIGH (ref 70–99)
Potassium: 3.5 mEq/L (ref 3.5–5.1)
Sodium: 128 mEq/L — ABNORMAL LOW (ref 135–145)

## 2019-07-27 LAB — VITAMIN D 25 HYDROXY (VIT D DEFICIENCY, FRACTURES): VITD: 26.5 ng/mL — ABNORMAL LOW (ref 30.00–100.00)

## 2019-07-27 LAB — LIPID PANEL
Cholesterol: 224 mg/dL — ABNORMAL HIGH (ref 0–200)
HDL: 123.3 mg/dL (ref >39.00)
LDL Cholesterol: 79 mg/dL (ref 0–99)
NonHDL: 100.35
Total CHOL/HDL Ratio: 2
Triglycerides: 107 mg/dL (ref 0.0–149.0)
VLDL: 21.4 mg/dL (ref 0.0–40.0)

## 2019-07-27 LAB — POCT GLYCOSYLATED HEMOGLOBIN (HGB A1C): Hemoglobin A1C: 11.6 % — AB (ref 4.0–5.6)

## 2019-07-27 LAB — GLUCOSE, POCT (MANUAL RESULT ENTRY): POC Glucose: 424 mg/dl — AB (ref 70–99)

## 2019-07-27 MED ORDER — INSULIN PEN NEEDLE 32G X 4 MM MISC: 1.0000 | Freq: Four times a day (QID) | 11 refills | Status: AC

## 2019-07-27 MED ORDER — FREESTYLE LIBRE 2 READER DEVI: 1.0000 | 0 refills | Status: AC

## 2019-07-27 MED ORDER — LANTUS SOLOSTAR 100 UNIT/ML ~~LOC~~ SOPN: 22.0000 [IU] | PEN_INJECTOR | Freq: Every day | SUBCUTANEOUS | 11 refills | Status: AC

## 2019-07-27 MED ORDER — FREESTYLE LIBRE 2 SENSOR MISC: 1.0000 | 6 refills | Status: AC

## 2019-07-27 MED ORDER — INSULIN LISPRO (1 UNIT DIAL) 100 UNIT/ML (KWIKPEN): 6.0000 [IU] | PEN_INJECTOR | Freq: Three times a day (TID) | SUBCUTANEOUS | 11 refills | Status: AC

## 2019-07-27 MED ORDER — METFORMIN HCL 500 MG PO TABS: 500.0000 mg | ORAL_TABLET | Freq: Two times a day (BID) | ORAL | 3 refills | Status: AC

## 2019-07-27 NOTE — Progress Notes (Signed)
Name: Mark Kirby  MRN/ DOB: [id number], Nov 17, 1965   Age/ Sex: 54 y.o., male    PCP: System, Provider Not In   Reason for Endocrinology Evaluation: Type 2 Diabetes Mellitus     Date of Initial Endocrinology Visit: 08/01/2019     PATIENT IDENTIFIER: Mark Kirby is a 54 y.o. male with a past medical history of . The patient presented for initial endocrinology clinic visit on 08/01/2019 for consultative assistance with his diabetes management.    HPI: Mark Kirby was    Diagnosed with DM in  2014 Prior Medications tried/Intolerance: Novolog  Currently checking blood sugars occasionally  Hypoglycemia episodes : no          Hemoglobin A1c has ranged from 11.8% in 2017, peaking at 15.7% in 2014. Patient required assistance for hypoglycemia: Yes - last time was 2016 Patient has required hospitalization within the last 1 year from hyper or hypoglycemia: Yes   In terms of diet, the patient eats 3 meals a day, does not snack. Does not drink any sugar-sweetened beverages   Admits to binge drinking once a week during the pandemic. Last drink was last week  No prior diganosi   HOME DIABETES REGIMEN: Lantus 25 units daily  Metformin 500 mg daily  BID     Statin: no ACE-I/ARB: no Prior Diabetic Education: Yes   METER DOWNLOAD SUMMARY: Did not bring    DIABETIC COMPLICATIONS: Microvascular complications:    Denies: retinopathy, neuropathy , CKD  Last eye exam: Completed 2019  Macrovascular complications:    Denies: CAD, PVD, CVA   PAST HISTORY: Past Medical History:  Past Medical History:  Diagnosis Date  . DKA (diabetic ketoacidoses) ([place]) 10/17/2012  . Pancreatitis   . Seasonal allergies    Past Surgical History:  Past Surgical History:  Procedure Laterality Date  . NO PAST SURGERIES        Social History:  reports that he has never smoked. He has never used smokeless tobacco. He reports current alcohol use. He reports that he does not use  drugs. Family History:  Family History  Problem Relation Age of Onset  . Diabetes Cousin   . Diabetes Other        Uncle  . Colon cancer Paternal Uncle   . Colon cancer Paternal Grandfather      HOME MEDICATIONS: Allergies as of 07/27/2019   No Known Allergies     Medication List       Accurate as of Jul 27, 2019 11:59 PM. If you have any questions, ask your nurse or doctor.        STOP taking these medications   insulin glargine 100 UNIT/ML injection Commonly known as: LANTUS Replaced by: Lantus SoloStar 100 UNIT/ML Solostar Pen Stopped by: Dorita Sciara, MD     TAKE these medications   FreeStyle Libre 2 Reader Devi 1 Device by Does not apply route as directed. Started by: Dorita Sciara, MD   FreeStyle Libre 2 Sensor Misc 1 Device by Does not apply route as directed. Started by: Dorita Sciara, MD   glucose blood test strip Use as instructed   glucose monitoring kit monitoring kit 1 each by Does not apply route as needed for other. Or any available-please ensure receives appropriate test strips and lancets   ibuprofen 200 MG tablet Commonly known as: ADVIL Take 400 mg by mouth every 4 (four) hours as needed (pain).   insulin lispro 100 UNIT/ML KwikPen Commonly known as: HumaLOG  KwikPen Inject 0.06 mLs (6 Units total) into the skin 3 (three) times daily. Started by: Dorita Sciara, MD   Insulin Pen Needle 32G X 4 MM Misc 1 Device by Does not apply route in the morning, at noon, in the evening, and at bedtime. Started by: Dorita Sciara, MD   Insulin Syringes (Disposable) U-100 1 ML Misc Use to give your prescribed insulin   Lantus SoloStar 100 UNIT/ML Solostar Pen Generic drug: insulin glargine Inject 22 Units into the skin daily. Replaces: insulin glargine 100 UNIT/ML injection Started by: Dorita Sciara, MD   metFORMIN 500 MG tablet Commonly known as: GLUCOPHAGE Take 1 tablet (500 mg total) by mouth 2 (two)  times daily with a meal. What changed:   medication strength  how much to take  when to take this Changed by: Dorita Sciara, MD   multivitamin with minerals Tabs tablet Take 1 tablet by mouth daily.   naltrexone 50 MG tablet Commonly known as: DEPADE Take 1 tablet (50 mg total) by mouth daily.        ALLERGIES: No Known Allergies   REVIEW OF SYSTEMS: A comprehensive ROS was conducted with the patient and is negative except as per HPI and below:  Review of Systems  Gastrointestinal: Negative for diarrhea and nausea.  Genitourinary: Negative for frequency.  Endo/Heme/Allergies: Negative for polydipsia.      OBJECTIVE:   VITAL SIGNS: BP (!) 164/100 (BP Location: Left Arm, Patient Position: Sitting, Cuff Size: Normal)   Pulse 80   Temp 98.7 F (37.1 C)   Ht 5' 7.44" (1.713 m)   Wt 171 lb 6.4 oz (77.7 kg)   SpO2 98%   BMI 26.50 kg/m    PHYSICAL EXAM:  General: Mark Kirby appears well and is in NAD  HEENT:  Eyes: External eye exam normal without stare, lid lag or exophthalmos.  EOM intact.    Neck: General: Supple without adenopathy or carotid bruits. Thyroid: Thyroid size normal.  No goiter or nodules appreciated. No thyroid bruit.  Lungs: Clear with good BS bilat with no rales, rhonchi, or wheezes  Heart: RRR with normal S1 and S2 and no gallops; no murmurs; no rub  Abdomen: Normoactive bowel sounds, soft, nontender, without masses or organomegaly palpable  Extremities:  Lower extremities - No pretibial edema. No lesions.  Skin: Normal texture and temperature to palpation.   Neuro: MS is good with appropriate affect, Mark Kirby is alert and Ox3     DATA REVIEWED:  Lab Results  Component Value Date   HGBA1C 11.6 (A) 07/27/2019   HGBA1C 12.0 (H) 05/03/2018   HGBA1C 11.8 (H) 09/18/2015   Lab Results  Component Value Date   LDLCALC 79 07/27/2019   CREATININE 0.78 07/27/2019   No results found for: [facility]  Lab Results  Component Value Date   CHOL 224  (H) 07/27/2019   HDL 123.30 07/27/2019   LDLCALC 79 07/27/2019   TRIG 107.0 07/27/2019   CHOLHDL 2 07/27/2019        ASSESSMENT / PLAN / RECOMMENDATIONS:   1) Type 2 Diabetes Mellitus, Poorly controlled, Without complications - Most recent A1c of 11.6%. Goal A1c < 7.0 %.    Plan: GENERAL: I have discussed with the patient the pathophysiology of diabetes. We went over the natural progression of the disease. We talked about both insulin resistance and insulin deficiency. We stressed the importance of lifestyle changes including diet and exercise. I explained the complications associated with diabetes including retinopathy, nephropathy, neuropathy  as well as increased risk of cardiovascular disease. We went over the benefit seen with glycemic control.    I explained to the patient that diabetic patients are at higher than normal risk for amputations. Discussed pharmacokinetics of basal/bolus insulin and the importance of taking prandial insulin with meals.   We also discussed avoiding sugar-sweetened beverages and snacks, when possible.   His In-office BG was 338 mg/dL, will start a standing dose of prandial insulin, will consider add-on therapy in the future.   MEDICATIONS: - Decrease Lantus 22 units daily  - Humalog  6 units with each meal  - Continue Metformin 500 mg twice daily   EDUCATION / INSTRUCTIONS:  BG monitoring instructions: Patient is instructed to check his blood sugars 4 times a day, before meals and bedtime .  Call [facility] if: BG persistently < 70 or > 300. . I reviewed the Rule of 15 for the treatment of hypoglycemia in detail with the patient. Literature supplied.   2) Diabetic complications:   Eye: Does no have known diabetic retinopathy.   Neuro/ Feet: Does not have known diabetic peripheral neuropathy.  Renal: Patient does not have known baseline CKD. He is not on an ACEI/ARB at present.  3) Lipids: Patient is not on a statin, LDL at  79 mg.dL. Discussed cardiovascular benefits, will discussed risk vs benefits in the future.    4) Vitamin D Insufficiency:  - Mark Kirby c/o fatigue and would like vitamin D checked as he has had it low in the past - Vitamin D slightly low, will advised Mark Kirby to start OTC vitamin D3 at 1000 iu daily    F/U in 3 months     Signed electronically by: Mack Guise, MD  [facility] Endocrinology  [address] [place], [place] [address] Phone: [phone] FAX: [phone]   CC: System, Provider Not In No address on file Phone: None  Fax: None    Return to Endocrinology clinic as below: Future Appointments  Date Time Provider [place]  11/03/2019  8:10 AM Allina Riches, Melanie Crazier, MD [id number] None

## 2019-07-27 NOTE — Patient Instructions (Addendum)
-   Decrease Lantus 22 units daily  - Humalog  6 units with each meal  - Continue Metformin 500 mg twice daily    - Check sugar before meals and bedtime    HOW TO TREAT LOW BLOOD SUGARS (Blood sugar LESS THAN 70 MG/DL)  Please follow the RULE OF 15 for the treatment of hypoglycemia treatment (when your (blood sugars are less than 70 mg/dL)    STEP 1: Take 15 grams of carbohydrates when your blood sugar is low, which includes:   3-4 GLUCOSE TABS  OR  3-4 OZ OF JUICE OR REGULAR SODA OR  ONE TUBE OF GLUCOSE GEL     STEP 2: RECHECK blood sugar in 15 MINUTES STEP 3: If your blood sugar is still low at the 15 minute recheck --> then, go back to STEP 1 and treat AGAIN with another 15 grams of carbohydrates.

## 2019-08-30 ENCOUNTER — Telehealth: Payer: Self-pay | Admitting: Internal Medicine

## 2019-09-01 NOTE — Telephone Encounter (Signed)
Contacted pt and went over Dr. Johnson message pt doesn't have any questions or concerns  

## 2019-09-15 ENCOUNTER — Other Ambulatory Visit: Payer: Self-pay

## 2019-09-15 ENCOUNTER — Encounter: Payer: Self-pay | Admitting: Internal Medicine

## 2019-09-15 ENCOUNTER — Emergency Department (HOSPITAL_COMMUNITY): Payer: 59

## 2019-09-15 ENCOUNTER — Other Ambulatory Visit (HOSPITAL_COMMUNITY)
Admission: RE | Admit: 2019-09-15 | Discharge: 2019-09-15 | Disposition: A | Discharge status: Home / Self Care | Payer: 59 | Source: Ambulatory Visit | Attending: Internal Medicine | Admitting: Internal Medicine

## 2019-09-15 ENCOUNTER — Ambulatory Visit (HOSPITAL_BASED_OUTPATIENT_CLINIC_OR_DEPARTMENT_OTHER): Payer: 59 | Admitting: Internal Medicine

## 2019-09-15 ENCOUNTER — Emergency Department (HOSPITAL_COMMUNITY)
Admission: EM | Admit: 2019-09-15 | Discharge: 2019-09-15 | Disposition: A | Discharge status: Home / Self Care | Payer: 59 | Attending: Emergency Medicine | Admitting: Emergency Medicine

## 2019-09-15 VITALS — BP 129/86 | HR 94 | Resp 16 | Wt 172.8 lb

## 2019-09-15 DIAGNOSIS — E1110 Type 2 diabetes mellitus with ketoacidosis without coma: Secondary | ICD-10-CM | POA: Insufficient documentation

## 2019-09-15 DIAGNOSIS — E559 Vitamin D deficiency, unspecified: Secondary | ICD-10-CM

## 2019-09-15 DIAGNOSIS — Z113 Encounter for screening for infections with a predominantly sexual mode of transmission: Secondary | ICD-10-CM | POA: Diagnosis not present

## 2019-09-15 DIAGNOSIS — E119 Type 2 diabetes mellitus without complications: Secondary | ICD-10-CM

## 2019-09-15 DIAGNOSIS — F1024 Alcohol dependence with alcohol-induced mood disorder: Secondary | ICD-10-CM | POA: Diagnosis not present

## 2019-09-15 DIAGNOSIS — E1165 Type 2 diabetes mellitus with hyperglycemia: Secondary | ICD-10-CM

## 2019-09-15 DIAGNOSIS — R569 Unspecified convulsions: Secondary | ICD-10-CM

## 2019-09-15 DIAGNOSIS — F1099 Alcohol use, unspecified with unspecified alcohol-induced disorder: Secondary | ICD-10-CM | POA: Insufficient documentation

## 2019-09-15 DIAGNOSIS — F322 Major depressive disorder, single episode, severe without psychotic features: Secondary | ICD-10-CM | POA: Insufficient documentation

## 2019-09-15 DIAGNOSIS — Z794 Long term (current) use of insulin: Secondary | ICD-10-CM | POA: Insufficient documentation

## 2019-09-15 DIAGNOSIS — Z789 Other specified health status: Secondary | ICD-10-CM

## 2019-09-15 DIAGNOSIS — E876 Hypokalemia: Secondary | ICD-10-CM | POA: Diagnosis not present

## 2019-09-15 LAB — URINALYSIS, ROUTINE W REFLEX MICROSCOPIC
Bilirubin Urine: NEGATIVE
Glucose, UA: 50 mg/dL — AB
Hgb urine dipstick: NEGATIVE
Ketones, ur: NEGATIVE mg/dL
Leukocytes,Ua: NEGATIVE
Nitrite: NEGATIVE
Protein, ur: NEGATIVE mg/dL
Specific Gravity, Urine: 1.004 — ABNORMAL LOW (ref 1.005–1.030)
pH: 6 (ref 5.0–8.0)

## 2019-09-15 LAB — COMPREHENSIVE METABOLIC PANEL
ALT: 61 U/L — ABNORMAL HIGH (ref 0–44)
AST: 84 U/L — ABNORMAL HIGH (ref 15–41)
Albumin: 3.6 g/dL (ref 3.5–5.0)
Alkaline Phosphatase: 79 U/L (ref 38–126)
Anion gap: 15 (ref 5–15)
BUN: 6 mg/dL (ref 6–20)
CO2: 26 mmol/L (ref 22–32)
Calcium: 8.7 mg/dL — ABNORMAL LOW (ref 8.9–10.3)
Chloride: 90 mmol/L — ABNORMAL LOW (ref 98–111)
Creatinine, Ser: 0.61 mg/dL (ref 0.61–1.24)
GFR calc Af Amer: >60 mL/min (ref >60)
GFR calc non Af Amer: >60 mL/min (ref >60)
Glucose, Bld: 151 mg/dL — ABNORMAL HIGH (ref 70–99)
Potassium: 2.8 mmol/L — ABNORMAL LOW (ref 3.5–5.1)
Sodium: 131 mmol/L — ABNORMAL LOW (ref 135–145)
Total Bilirubin: 1.1 mg/dL (ref 0.3–1.2)
Total Protein: 6.1 g/dL — ABNORMAL LOW (ref 6.5–8.1)

## 2019-09-15 LAB — RAPID URINE DRUG SCREEN, HOSP PERFORMED
Amphetamines: NOT DETECTED
Barbiturates: NOT DETECTED
Benzodiazepines: NOT DETECTED
Cocaine: NOT DETECTED
Opiates: NOT DETECTED
Tetrahydrocannabinol: NOT DETECTED

## 2019-09-15 LAB — CBC WITH DIFFERENTIAL/PLATELET
Abs Immature Granulocytes: 0.02 10*3/uL (ref 0.00–0.07)
Basophils Absolute: 0 10*3/uL (ref 0.0–0.1)
Basophils Relative: 0 %
Eosinophils Absolute: 0.1 10*3/uL (ref 0.0–0.5)
Eosinophils Relative: 2 %
HCT: 32.6 % — ABNORMAL LOW (ref 39.0–52.0)
Hemoglobin: 11.2 g/dL — ABNORMAL LOW (ref 13.0–17.0)
Immature Granulocytes: 0 %
Lymphocytes Relative: 15 %
Lymphs Abs: 0.9 10*3/uL (ref 0.7–4.0)
MCH: 35.1 pg — ABNORMAL HIGH (ref 26.0–34.0)
MCHC: 34.4 g/dL (ref 30.0–36.0)
MCV: 102.2 fL — ABNORMAL HIGH (ref 80.0–100.0)
Monocytes Absolute: 0.5 10*3/uL (ref 0.1–1.0)
Monocytes Relative: 8 %
Neutro Abs: 4.4 10*3/uL (ref 1.7–7.7)
Neutrophils Relative %: 75 %
Platelets: 122 10*3/uL — ABNORMAL LOW (ref 150–400)
RBC: 3.19 MIL/uL — ABNORMAL LOW (ref 4.22–5.81)
RDW: 12.1 % (ref 11.5–15.5)
WBC: 5.9 10*3/uL (ref 4.0–10.5)
nRBC: 0 % (ref 0.0–0.2)

## 2019-09-15 LAB — CBG MONITORING, ED: Glucose-Capillary: 151 mg/dL — ABNORMAL HIGH (ref 70–99)

## 2019-09-15 LAB — GLUCOSE, POCT (MANUAL RESULT ENTRY): POC Glucose: 139 mg/dl — AB (ref 70–99)

## 2019-09-15 LAB — MAGNESIUM: Magnesium: 1.2 mg/dL — ABNORMAL LOW (ref 1.7–2.4)

## 2019-09-15 MED ORDER — POTASSIUM CHLORIDE 10 MEQ/100ML IV SOLN
10.0000 meq | INTRAVENOUS | Status: AC
  Administered 2019-09-15 (×2): 10 meq via INTRAVENOUS
  Filled 2019-09-15 (×2): qty 100

## 2019-09-15 MED ORDER — SODIUM CHLORIDE 0.9 % IV BOLUS
1000.0000 mL | Freq: Once | INTRAVENOUS | Status: AC
  Administered 2019-09-15: 1000 mL via INTRAVENOUS

## 2019-09-15 MED ORDER — LORAZEPAM 2 MG/ML IJ SOLN: 1.0000 mg | INTRAMUSCULAR | Status: DC | PRN

## 2019-09-15 MED ORDER — MAGNESIUM SULFATE 2 GM/50ML IV SOLN
2.0000 g | Freq: Once | INTRAVENOUS | Status: AC
  Administered 2019-09-15: 2 g via INTRAVENOUS
  Filled 2019-09-15: qty 50

## 2019-09-15 MED ORDER — THIAMINE HCL 100 MG PO TABS
100.0000 mg | ORAL_TABLET | Freq: Every day | ORAL | Status: DC
  Administered 2019-09-15: 100 mg via ORAL
  Filled 2019-09-15: qty 1

## 2019-09-15 MED ORDER — THIAMINE HCL 100 MG/ML IJ SOLN: 100.0000 mg | Freq: Every day | INTRAMUSCULAR | Status: DC

## 2019-09-15 MED ORDER — CHLORDIAZEPOXIDE HCL 25 MG PO CAPS: ORAL_CAPSULE | ORAL | 0 refills | Status: AC

## 2019-09-15 MED ORDER — POTASSIUM CHLORIDE ER 10 MEQ PO TBCR: 10.0000 meq | EXTENDED_RELEASE_TABLET | Freq: Every day | ORAL | 0 refills | Status: AC

## 2019-09-15 MED ORDER — CHLORDIAZEPOXIDE HCL 25 MG PO CAPS
50.0000 mg | ORAL_CAPSULE | Freq: Once | ORAL | Status: AC
  Administered 2019-09-15: 50 mg via ORAL
  Filled 2019-09-15: qty 2

## 2019-09-15 MED ORDER — LORAZEPAM 1 MG PO TABS: 1.0000 mg | ORAL_TABLET | ORAL | Status: DC | PRN

## 2019-09-15 MED ORDER — FOLIC ACID 1 MG PO TABS
1.0000 mg | ORAL_TABLET | Freq: Every day | ORAL | Status: DC
  Administered 2019-09-15: 1 mg via ORAL
  Filled 2019-09-15: qty 1

## 2019-09-15 MED ORDER — MAGNESIUM 500 MG PO TABS: 500.0000 mg | ORAL_TABLET | Freq: Every day | ORAL | 0 refills | Status: AC

## 2019-09-15 NOTE — Progress Notes (Signed)
Patient ID: Mark Kirby, male    DOB: Oct 18, 1965  MRN: [id number]  CC: Diabetes and Fatigue   Subjective: Mark Kirby is a 54 y.o. male who presents for chronic ds management. His concerns today include:  Patient with history of DM type II, depression, EtOH use disorder, pancreatitis, vit D def,   I had my first visit with him via telemedicine on 06/20/2019.  He has history of diabetes.  I have referred him to endocrinologist Dr. Haze Justin.  He saw her the end of May.  She decreased Lantus to 22 units, Humalog 6 units with meals and Metformin 500 units twice a day.  Wants screening for STI.  No dysuria, active with 1 partner.    Depression:  Seen at [facility] 07/20/19 as walk-in for worsening depression Seeing a therapist at [place] 2 wks.  Has appt with NP to get est.  Started on Trazodone and ? Zoloft (he is not sure and does not have medicines with him today).he states that the medications have been requested and he should be receiving them in the mail by next week Wednesday. No SI/HI.   Also has a Economist through [facility].  Talks with her Q 2 wks.  -He also has history of EtOH use disorder.  "When I drink I drink."  Triggers include death of his sister and "the pandemic did not help.  This has not been a good wk.  My mom is in the hospital in [place]." Pt states his drinking is up and down.  Drinks a pint on weekend, sometimes a gallon of [place]. This week he drank more beer.  He last drank two 40 ounce beers yesterday evening. -went through rehab programs in past.  Did [facility] for 30 days then transition to [facility] in [place] in 2015. Looking at doing rehab again but in [place].  Ones locally are too expensive.  Currently doing AA.  Not interested in Naltrexone  Reports having a big knot over his right eye several days ago that has subsequently resolved.  He is not sure how he got it.  Not sure if he fell.  Colon cancer screen: This was ordered on last visit.  He  did receive the kit but has not mailed it out as yet.  He states he will do so within the week.    Vit D def:  Did not get vitamin D supplement as recommended by endocrine Patient Active Problem List   Diagnosis Date Noted  . Dyslipidemia 07/27/2019  . Type 2 diabetes mellitus with hyperglycemia, with long-term current use of insulin ([place]) 07/27/2019  . Vitamin D deficiency 06/20/2019  . Positive depression screening 06/20/2019  . Alcoholic ketoacidosis 43/15/4008  . Diabetes mellitus type 1.5 ([place]) 09/18/2015  . Alcohol use disorder 10/18/2012  . DKA (diabetic ketoacidoses) ([place]) 10/17/2012  . Pancreatitis, acute 10/17/2012     No current facility-administered medications on file prior to visit.   Current Outpatient Medications on File Prior to Visit  Medication Sig Dispense Refill  . Continuous Blood Gluc Receiver (FREESTYLE LIBRE 2 READER) DEVI 1 Device by Does not apply route as directed. 1 each 0  . Continuous Blood Gluc Sensor (FREESTYLE LIBRE 2 SENSOR) MISC 1 Device by Does not apply route as directed. 2 each 6  . glucose blood test strip Use as instructed (Patient not taking: Reported on 05/03/2018) 100 each 12  . glucose monitoring kit (FREESTYLE) monitoring kit 1 each by Does not apply route  as needed for other. Or any available-please ensure receives appropriate test strips and lancets (Patient not taking: Reported on 05/03/2018) 1 each 0  . insulin glargine (LANTUS SOLOSTAR) 100 UNIT/ML Solostar Pen Inject 22 Units into the skin daily. 15 mL 11  . insulin lispro (HUMALOG KWIKPEN) 100 UNIT/ML KwikPen Inject 0.06 mLs (6 Units total) into the skin 3 (three) times daily. 15 mL 11  . Insulin Pen Needle 32G X 4 MM MISC 1 Device by Does not apply route in the morning, at noon, in the evening, and at bedtime. 150 each 11  . Insulin Syringes, Disposable, U-100 1 ML MISC Use to give your prescribed insulin (Patient not taking: Reported on 05/03/2018) 500 each 0  . metFORMIN (GLUCOPHAGE) 500  MG tablet Take 1 tablet (500 mg total) by mouth 2 (two) times daily with a meal. 180 tablet 3  . Multiple Vitamin (MULTIVITAMIN WITH MINERALS) TABS tablet Take 1 tablet by mouth daily.      No Known Allergies  Social History   Socioeconomic History  . Marital status: Single    Spouse name: Not on file  . Number of children: Not on file  . Years of education: Not on file  . Highest education level: Not on file  Occupational History  . Not on file  Tobacco Use  . Smoking status: Never Smoker  . Smokeless tobacco: Never Used  Substance and Sexual Activity  . Alcohol use: Yes    Alcohol/week: 0.0 standard drinks    Comment: currently in treatment  . Drug use: No    Comment: hx  . Sexual activity: Yes  Other Topics Concern  . Not on file  Social History Narrative  . Not on file   Social Determinants of Health   Financial Resource Strain:   . Difficulty of Paying Living Expenses:   Food Insecurity:   . Worried About Charity fundraiser in the Last Year:   . Arboriculturist in the Last Year:   Transportation Needs:   . Film/video editor (Medical):   Marland Kitchen Lack of Transportation (Non-Medical):   Physical Activity:   . Days of Exercise per Week:   . Minutes of Exercise per Session:   Stress:   . Feeling of Stress :   Social Connections:   . Frequency of Communication with Friends and Family:   . Frequency of Social Gatherings with Friends and Family:   . Attends Religious Services:   . Active Member of Clubs or Organizations:   . Attends Archivist Meetings:   Marland Kitchen Marital Status:   Intimate Partner Violence:   . Fear of Current or Ex-Partner:   . Emotionally Abused:   Marland Kitchen Physically Abused:   . Sexually Abused:     Family History  Problem Relation Age of Onset  . Diabetes Cousin   . Diabetes Other        Uncle  . Colon cancer Paternal Uncle   . Colon cancer Paternal Grandfather     Past Surgical History:  Procedure Laterality Date  . NO PAST  SURGERIES      ROS: Review of Systems Negative except as stated above  PHYSICAL EXAM: BP 129/86   Pulse 94   Resp 16   Wt 172 lb 12.8 oz (78.4 kg)   SpO2 98%   BMI 26.71 kg/m   Wt Readings from Last 3 Encounters:  09/15/19 172 lb 6.4 oz (78.2 kg)  09/15/19 172 lb 12.8 oz (78.4 kg)  07/27/19 171 lb 6.4 oz (77.7 kg)    Physical Exam General appearance - alert, well appearing, middle-aged African-American male and in no distress Mental status -quiet demeanor  eyes -pale conjunctiva  mouth - mucous membranes moist, pharynx normal without lesions Neck - supple, no significant adenopathy Chest - clear to auscultation, no wheezes, rales or rhonchi, symmetric air entry Heart - normal rate, regular rhythm, normal S1, S2, no murmurs, rubs, clicks or gallops Extremities - peripheral pulses normal, no pedal edema, no clubbing or cyanosis Skin: No bruising or lumps seen above the right eye at this time. As I was concluding our visit.  Patient was noted to have seizure activity with eyes rolled back and tonic-clonic activity.  This lasted about 2 minutes with patient returned back to his baseline.  He was not aware that he had a seizure.  Blood sugar reading at that time was 1 59, pulse ox 98%, blood pressure was 124/83.  EMS was called.  Patient then had another tonic-clonic seizure that lasted about 3 minutes. Slight confusion after the activity.  Patient denied having any previous seizure episodes. Depression screen [facility] 2/9 09/15/2019 06/20/2019  Decreased Interest 3 0  Down, Depressed, Hopeless 2 3  PHQ - 2 Score 5 3  Altered sleeping 3 3  Tired, decreased energy 2 2  Change in appetite 2 2  Feeling bad or failure about yourself  1 1  Trouble concentrating 1 3  Moving slowly or fidgety/restless 1 0  Suicidal thoughts 0 0  PHQ-9 Score 15 14    CMP Latest Ref Rng & Units 07/27/2019 04/01/2019 04/01/2019  Glucose 70 - 99 mg/dL 338(H) 179(H) -  BUN 6 - 23 mg/dL 8 4(L) -  Creatinine 0.40  - 1.50 mg/dL 0.78 0.60(L) -  Sodium 135 - 145 mEq/L 128(L) 133(L) 128(L)  Potassium 3.5 - 5.1 mEq/L 3.5 3.2(L) 3.6  Chloride 96 - 112 mEq/L 90(L) 92(L) -  CO2 19 - 32 mEq/L 27 - -  Calcium 8.4 - 10.5 mg/dL 9.4 - -  Total Protein 6.5 - 8.1 g/dL - - -  Total Bilirubin 0.3 - 1.2 mg/dL - - -  Alkaline Phos 38 - 126 U/L - - -  AST 15 - 41 U/L - - -  ALT 0 - 44 U/L - - -   Lipid Panel     Component Value Date/Time   CHOL 224 (H) 07/27/2019 0853   TRIG 107.0 07/27/2019 0853   HDL 123.30 07/27/2019 0853   CHOLHDL 2 07/27/2019 0853   VLDL 21.4 07/27/2019 0853   LDLCALC 79 07/27/2019 0853    CBC    Component Value Date/Time   WBC 5.9 09/15/2019 1227   RBC 3.19 (L) 09/15/2019 1227   HGB 11.2 (L) 09/15/2019 1227   HCT 32.6 (L) 09/15/2019 1227   PLT 122 (L) 09/15/2019 1227   MCV 102.2 (H) 09/15/2019 1227   MCH 35.1 (H) 09/15/2019 1227   MCHC 34.4 09/15/2019 1227   RDW 12.1 09/15/2019 1227   LYMPHSABS 0.9 09/15/2019 1227   MONOABS 0.5 09/15/2019 1227   EOSABS 0.1 09/15/2019 1227   BASOSABS 0.0 09/15/2019 1227    ASSESSMENT AND PLAN: 1. Current severe episode of major depressive disorder without psychotic features without prior episode [facility]) Patient has been plugged into mental health services.  He will be receiving his medications through the mail early next week per his report.  2. Alcohol-induced depressive disorder with moderate or severe use disorder ([place]) Strongly advised him  to quit.  Discussed health risks associated with heavy alcohol use.  He is currently in an [place] group.  3. Witnessed seizure-like activity ([place]) New onset.  May be alcohol withdrawal seizures but other etiologies need to be ruled out.  He will need imaging of his head as he did tell me that he had a knot on his head above the right eye several days ago and he had no knowledge of how he got it.  He may have fallen and had a seizure at home and was unaware.  4. Routine screening for STI (sexually  transmitted infection) Urine sent for STD screening.  We will going to draw blood for HIV, syphilis and hepatitis C screening but held off on doing so after patient started having seizure here in the office.  We will get the studies on a subsequent visit  5. Vitamin D deficiency Advised patient to purchase vitamin D 1000 IU over-the-counter and take 1 daily  6. Type 2 diabetes mellitus without complication, with long-term current use of insulin ([place]) Followed by endocrinology.  This was not addressed in detail today. - POCT glucose (manual entry) - Urine cytology ancillary only - Microalbumin / creatinine urine ratio    Patient was given the opportunity to ask questions.  Patient verbalized understanding of the plan and was able to repeat key elements of the plan.   Orders Placed This Encounter  Procedures  . Microalbumin / creatinine urine ratio  . POCT glucose (manual entry)     Requested Prescriptions    No prescriptions requested or ordered in this encounter    No follow-ups on file.  Karle Plumber, MD, FACP

## 2019-09-15 NOTE — Discharge Instructions (Signed)
You may have had a seizure earlier today.  This could have been caused by alcohol withdrawal, or else from your low magnesium blood level.  I gave you magnesium and potassium in your IV today.  I also prescribed 2 weeks of supplemental pills to take at home.  You need to stop drinking alcohol, but you should not stop cold [place], or you may have another seizure. You need to GRADUALLY taper your alcohol use.  You should continue having one drink every 12 hours for the next week.  Then cut back to one drink every 24 hours.  Then stop.  I prescribe librium for a few days, which is a benzo drug that can reduce your withdrawal symptoms.

## 2019-09-15 NOTE — ED Notes (Signed)
Patient given discharge instructions. Questions were answered. Patient verbalized understanding of discharge instructions and care at home.  

## 2019-09-15 NOTE — ED Triage Notes (Signed)
Patient arrives via EMS from doctors office due to having a seizure. Per ems, the patient was seeing the doctor for his regular follow-up when he had a seizure (shaking, eyes rolling back in his head) that lasted 1-2 min. The patient then had a second seizure that lasted about 10 seconds. Ems was called at that time. Patient has no history of seizures, but does have a history of ETOH use.   Arrives with 18g in L. AC

## 2019-09-15 NOTE — ED Provider Notes (Addendum)
[facility] EMERGENCY DEPARTMENT Provider Note   CSN: [id number] Arrival date & time: 09/15/19  1128     History CC: Seizure-like episode  Mark Kirby is a 54 y.o. male with a history of frequent alcohol use, presenting to emergency department seizure-like activity.  The patient was in his doctor's office today when he was noted to have generalized tonic-clonic shaking episode.  He does not recall the episode.  He denies urinary incontinence.  He denies any soreness or weakness.  He recalls waking up when EMS arrived.  He denies any prior history of seizures.  He does have a very heavy and extensive history of alcohol use.  He reports his last drink was yesterday.  He typically drinks half a gallon to a full gallon of mixed liquor, but tells me does not drink every single day.  He denies any prior history of alcohol withdrawal seizures.  He denies any history of brain tumor.  He denies any headache.  HPI     Past Medical History:  Diagnosis Date  . DKA (diabetic ketoacidoses) ([place]) 10/17/2012  . Pancreatitis   . Seasonal allergies     Patient Active Problem List   Diagnosis Date Noted  . Current severe episode of major depressive disorder without psychotic features without prior episode ([place]) 09/15/2019  . Alcohol-induced depressive disorder with moderate or severe use disorder ([place]) 09/15/2019  . Witnessed seizure-like activity ([place]) 09/15/2019  . Dyslipidemia 07/27/2019  . Type 2 diabetes mellitus with hyperglycemia, with long-term current use of insulin ([place]) 07/27/2019  . Vitamin D deficiency 06/20/2019  . Positive depression screening 06/20/2019  . Alcoholic ketoacidosis 43/15/4008  . Diabetes mellitus type 1.5 ([place]) 09/18/2015  . Alcohol use disorder 10/18/2012  . DKA (diabetic ketoacidoses) ([place]) 10/17/2012  . Pancreatitis, acute 10/17/2012    Past Surgical History:  Procedure Laterality Date  . NO PAST SURGERIES         Family History    Problem Relation Age of Onset  . Diabetes Cousin   . Diabetes Other        Uncle  . Colon cancer Paternal Uncle   . Colon cancer Paternal Grandfather     Social History   Tobacco Use  . Smoking status: Never Smoker  . Smokeless tobacco: Never Used  Substance Use Topics  . Alcohol use: Yes    Alcohol/week: 0.0 standard drinks    Comment: currently in treatment  . Drug use: No    Comment: hx    Home Medications Prior to Admission medications   Medication Sig Start Date End Date Taking? Authorizing Provider  insulin glargine (LANTUS SOLOSTAR) 100 UNIT/ML Solostar Pen Inject 22 Units into the skin daily. 07/27/19  Yes Shamleffer, Melanie Crazier, MD  insulin lispro (HUMALOG KWIKPEN) 100 UNIT/ML KwikPen Inject 0.06 mLs (6 Units total) into the skin 3 (three) times daily. 07/27/19  Yes Shamleffer, Melanie Crazier, MD  metFORMIN (GLUCOPHAGE) 500 MG tablet Take 1 tablet (500 mg total) by mouth 2 (two) times daily with a meal. 07/27/19  Yes Shamleffer, Melanie Crazier, MD  Multiple Vitamin (MULTIVITAMIN WITH MINERALS) TABS tablet Take 1 tablet by mouth daily.   Yes [provider]  chlordiazePOXIDE (LIBRIUM) 25 MG capsule 58m PO TID x 1D, then 25-527mPO BID X 1D, then 25-5049mO QD X 1D 09/15/19   Gordy Goar, MatCarola RhineD  Continuous Blood Gluc Receiver (FREESTYLE LIBRE 2 READER) DEVI 1 Device by Does not apply route as directed. 07/27/19   Shamleffer,  Melanie Crazier, MD  Continuous Blood Gluc Sensor (FREESTYLE LIBRE 2 SENSOR) MISC 1 Device by Does not apply route as directed. 07/27/19   Shamleffer, Melanie Crazier, MD  glucose blood test strip Use as instructed Patient not taking: Reported on 05/03/2018 10/19/12   Samella Parr, NP  glucose monitoring kit (FREESTYLE) monitoring kit 1 each by Does not apply route as needed for other. Or any available-please ensure receives appropriate test strips and lancets Patient not taking: Reported on 05/03/2018 10/20/12   Samella Parr, NP   Insulin Pen Needle 32G X 4 MM MISC 1 Device by Does not apply route in the morning, at noon, in the evening, and at bedtime. 07/27/19   Shamleffer, Melanie Crazier, MD  Insulin Syringes, Disposable, U-100 1 ML MISC Use to give your prescribed insulin Patient not taking: Reported on 05/03/2018 10/19/12   Samella Parr, NP  Magnesium 500 MG TABS Take 1 tablet (500 mg total) by mouth daily. 09/15/19   Wyvonnia Dusky, MD  potassium chloride (KLOR-CON) 10 MEQ tablet Take 1 tablet (10 mEq total) by mouth daily. 09/15/19   Wyvonnia Dusky, MD    Allergies    Patient has no known allergies.  Review of Systems   Review of Systems  Constitutional: Negative for chills and fever.  HENT: Negative for ear pain and sore throat.   Eyes: Negative for pain and visual disturbance.  Respiratory: Negative for cough and shortness of breath.   Cardiovascular: Negative for chest pain and palpitations.  Gastrointestinal: Negative for abdominal pain, nausea and vomiting.  Genitourinary: Negative for dysuria and penile swelling.  Musculoskeletal: Negative for arthralgias, back pain and myalgias.  Skin: Negative for color change and rash.  Neurological: Positive for seizures. Negative for syncope, facial asymmetry, weakness, light-headedness, numbness and headaches.  All other systems reviewed and are negative.   Physical Exam Updated Vital Signs BP 119/73 (BP Location: Right Arm)   Pulse 85   Temp 98.7 F (37.1 C) (Oral)   Resp 18   Ht '5\' 7"'  (1.702 m)   Wt 78.2 kg   SpO2 96%   BMI 27.00 kg/m   Physical Exam Vitals and nursing note reviewed.  Constitutional:      Appearance: He is well-developed.  HENT:     Head: Normocephalic and atraumatic.  Eyes:     Conjunctiva/sclera: Conjunctivae normal.  Cardiovascular:     Rate and Rhythm: Normal rate and regular rhythm.     Heart sounds: No murmur heard.   Pulmonary:     Effort: Pulmonary effort is normal. No respiratory distress.     Breath  sounds: Normal breath sounds.  Abdominal:     Palpations: Abdomen is soft.     Tenderness: There is no abdominal tenderness.  Musculoskeletal:     Cervical back: Neck supple.  Skin:    General: Skin is warm and dry.  Neurological:     General: No focal deficit present.     Mental Status: He is alert and oriented to person, place, and time.     GCS: GCS eye subscore is 4. GCS verbal subscore is 5. GCS motor subscore is 6.     Cranial Nerves: Cranial nerves are intact.     Sensory: Sensation is intact.     Motor: Motor function is intact.     Coordination: Coordination is intact.  Psychiatric:        Mood and Affect: Mood normal.        Behavior:  Behavior normal.     ED Results / Procedures / Treatments   Labs (all labs ordered are listed, but only abnormal results are displayed) Labs Reviewed  CBC WITH DIFFERENTIAL/PLATELET - Abnormal; Notable for the following components:      Result Value   RBC 3.19 (*)    Hemoglobin 11.2 (*)    HCT 32.6 (*)    MCV 102.2 (*)    MCH 35.1 (*)    Platelets 122 (*)    All other components within normal limits  COMPREHENSIVE METABOLIC PANEL - Abnormal; Notable for the following components:   Sodium 131 (*)    Potassium 2.8 (*)    Chloride 90 (*)    Glucose, Bld 151 (*)    Calcium 8.7 (*)    Total Protein 6.1 (*)    AST 84 (*)    ALT 61 (*)    All other components within normal limits  MAGNESIUM - Abnormal; Notable for the following components:   Magnesium 1.2 (*)    All other components within normal limits  URINALYSIS, ROUTINE W REFLEX MICROSCOPIC - Abnormal; Notable for the following components:   Specific Gravity, Urine 1.004 (*)    Glucose, UA 50 (*)    All other components within normal limits  CBG MONITORING, ED - Abnormal; Notable for the following components:   Glucose-Capillary 151 (*)    All other components within normal limits  RAPID URINE DRUG SCREEN, HOSP PERFORMED    EKG None  Radiology CT Head Wo  Contrast  Result Date: 09/15/2019 CLINICAL DATA:  Seizure, hit head on floor. EXAM: CT HEAD WITHOUT CONTRAST TECHNIQUE: Contiguous axial images were obtained from the base of the skull through the vertex without intravenous contrast. COMPARISON:  None FINDINGS: Brain: No acute intracranial abnormality. Specifically, no hemorrhage, hydrocephalus, mass lesion, acute infarction, or significant intracranial injury. Vascular: No hyperdense vessel or unexpected calcification. Skull: No acute calvarial abnormality. Sinuses/Orbits: Visualized paranasal sinuses and mastoids clear. Orbital soft tissues unremarkable. Other: None IMPRESSION: Normal study. Electronically Signed   By: Rolm Baptise M.D.   On: 09/15/2019 14:24   DG Chest Portable 1 View  Result Date: 09/15/2019 CLINICAL DATA:  Seizures EXAM: PORTABLE CHEST 1 VIEW COMPARISON:  09/18/2015 FINDINGS: Heart and mediastinal contours are within normal limits. No focal opacities or effusions. No acute bony abnormality. Old healed left mid clavicle fracture. IMPRESSION: No active disease. Electronically Signed   By: Rolm Baptise M.D.   On: 09/15/2019 13:09    Procedures .Critical Care Performed by: Wyvonnia Dusky, MD Authorized by: Wyvonnia Dusky, MD   Critical care provider statement:    Critical care time (minutes):  35   Critical care was necessary to treat or prevent imminent or life-threatening deterioration of the following conditions:  Metabolic crisis   Critical care was time spent personally by me on the following activities:  Discussions with consultants, evaluation of patient's response to treatment, examination of patient, ordering and performing treatments and interventions, ordering and review of laboratory studies, ordering and review of radiographic studies, pulse oximetry, re-evaluation of patient's condition, obtaining history from patient or surrogate and review of old charts Comments:     Hypomagnesemia & hypokalemia in setting  of suspected seizure, requiring IV repletion   (including critical care time)  Medications Ordered in ED Medications  LORazepam (ATIVAN) tablet 1-4 mg (has no administration in time range)    Or  LORazepam (ATIVAN) injection 1-4 mg (has no administration in time range)  thiamine  tablet 100 mg (100 mg Oral Given 09/15/19 1255)    Or  thiamine (B-1) injection 100 mg ( Intravenous See Alternative 7/68/08 8110)  folic acid (FOLVITE) tablet 1 mg (1 mg Oral Given 09/15/19 1255)  magnesium sulfate IVPB 2 g 50 mL (0 g Intravenous Stopped 09/15/19 1611)  potassium chloride 10 mEq in 100 mL IVPB (0 mEq Intravenous Stopped 09/15/19 1611)  sodium chloride 0.9 % bolus 1,000 mL (0 mLs Intravenous Stopped 09/15/19 1616)  chlordiazePOXIDE (LIBRIUM) capsule 50 mg (50 mg Oral Given 09/15/19 1503)    ED Course  I have reviewed the triage vital signs and the nursing notes.  Pertinent labs & imaging results that were available during my care of the patient were reviewed by me and considered in my medical decision making (see chart for details).  76-year male present emergency department with seizure-like activity reported in doctor's office.  Is a history of excessive alcohol use.  He does not have any obvious signs of delirium tremens at this time or acute alcohol withdrawal.  However with this history, cannot exclude the possibility that this was an alcohol withdrawal seizure.  We will still perform a full work-up first-time seizure, including neuroimaging with a CT scan of the brain, checking electrolytes magnesium level, checking UA, UDS, chest x-ray for infection.  He is overall well-appearing on exam.  He has no acute neuro deficits.  I personally reviewed his labs, imaging, and prior medical records, including his office visit note from earlier today.  Labs with hypoK and hypoMg, mild hyponatremia.  DG chest with no focal consolidation. UDS negative.  UA negative for infection.  Clinical Course as of Sep 14 1709  Fri Sep 15, 2019  1337 Macrocytic anemia consistent with chronic etoh use   [MT]  1447 Magnesium(!): 1.2 [MT]  1447 Potassium(!): 2.8 [MT]  1447 I have ordered IV repletion of both potassium and magnesium.  We will give this with some fluids.  The patient remains asymptomatic and is hungry asking for food.  Plan for discharge once the IV infusions are completed, he will need to go home on oral potassium and magnesium for a few days, librium taper, as well as gradually alcohol cessation   [MT]    Clinical Course User Index [MT] Langston Masker, Carola Rhine, MD   Signed out at 4:30 pm pending completion of IV infusions and discharge home with PO prescriptions.  Final Clinical Impression(s) / ED Diagnoses Final diagnoses:  Seizure-like activity ([place])  Hypokalemia  Hypomagnesemia  Alcohol use    Rx / DC Orders ED Discharge Orders         Ordered    Magnesium 500 MG TABS  Daily     Discontinue  Reprint     09/15/19 1619    potassium chloride (KLOR-CON) 10 MEQ tablet  Daily     Discontinue  Reprint     09/15/19 1619    chlordiazePOXIDE (LIBRIUM) 25 MG capsule     Discontinue  Reprint     09/15/19 1622           Wyvonnia Dusky, MD 09/15/19 1711    Wyvonnia Dusky, MD 09/15/19 1712

## 2019-09-15 NOTE — ED Notes (Signed)
Ask patient for urine sample, patient stated that he did not need to urinate at this time.  

## 2019-09-16 LAB — MICROALBUMIN / CREATININE URINE RATIO
Creatinine, Urine: 70.5 mg/dL
Microalb/Creat Ratio: 305 mg/g creat — ABNORMAL HIGH (ref 0–29)
Microalbumin, Urine: 214.8 ug/mL

## 2019-09-17 ENCOUNTER — Other Ambulatory Visit: Payer: Self-pay | Admitting: Internal Medicine

## 2019-09-17 ENCOUNTER — Encounter: Payer: Self-pay | Admitting: Internal Medicine

## 2019-09-17 ENCOUNTER — Telehealth (HOSPITAL_BASED_OUTPATIENT_CLINIC_OR_DEPARTMENT_OTHER): Payer: 59 | Admitting: Internal Medicine

## 2019-09-17 DIAGNOSIS — R569 Unspecified convulsions: Secondary | ICD-10-CM

## 2019-09-17 DIAGNOSIS — R809 Proteinuria, unspecified: Secondary | ICD-10-CM | POA: Insufficient documentation

## 2019-09-17 MED ORDER — LISINOPRIL 2.5 MG PO TABS
2.5000 mg | ORAL_TABLET | Freq: Every day | ORAL | 4 refills | Status: AC
Start: 2019-09-17 — End: ?

## 2019-09-18 ENCOUNTER — Telehealth: Payer: Self-pay

## 2019-09-18 LAB — URINE CYTOLOGY ANCILLARY ONLY
Chlamydia: NEGATIVE
Comment: NEGATIVE
Comment: NEGATIVE
Comment: NORMAL
Neisseria Gonorrhea: NEGATIVE
Trichomonas: NEGATIVE

## 2019-09-18 NOTE — Telephone Encounter (Signed)
Contacted pt to go over lab results pt didn't answer lvm asking pt to give a call back at his earliest convenience  

## 2019-09-20 ENCOUNTER — Telehealth: Payer: Self-pay | Admitting: Internal Medicine

## 2019-09-20 NOTE — Telephone Encounter (Signed)
PC placed to pt today.  I informed him that I received a form from [place] claims management services for what looks like short-term disability.  Patient was seen in the office last week and sent to the hospital with new onset seizures.  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  He tells me he has been off since the end of May.  He thinks that the endocrinologist Dr. Brooks Sailors took him out of work at that time and he is receiving short-term disability.  He wants to return to work on August 2.  Advised patient that I can complete the form starting with the date of 7/16 which was the date that I saw him in clinic last week when he had the seizure and had to be sent to the emergency room.  Patient informed that I have referred him to a neurologist and looks as though they tried to call him yesterday to schedule the appointment.  Patient states he may have gotten a call but did not answer if he did not recognize the number.  I advised him to call [facility] to schedule his appointment.  We will hold off on sending him back to work until he has been evaluated by the neurologist.  Advised patient to call me back and let me know the date of his appointment so that I can fill out his form with a return to work date.  He expressed understanding and states that he will do that.

## 2019-09-25 ENCOUNTER — Ambulatory Visit: Payer: 59 | Admitting: Diagnostic Neuroimaging

## 2019-09-25 ENCOUNTER — Encounter: Payer: Self-pay | Admitting: Diagnostic Neuroimaging

## 2019-09-25 ENCOUNTER — Telehealth: Payer: Self-pay | Admitting: *Deleted

## 2019-09-25 NOTE — Telephone Encounter (Signed)
Patient was no show for new patient appointment today. 

## 2019-09-27 ENCOUNTER — Telehealth: Payer: Self-pay | Admitting: Internal Medicine

## 2019-09-27 DIAGNOSIS — R569 Unspecified convulsions: Secondary | ICD-10-CM

## 2019-09-27 NOTE — Telephone Encounter (Signed)
Patient called to inform the doctor that he has an appt. That is in September and would like an appt. Earlier if possible.

## 2019-09-27 NOTE — Telephone Encounter (Signed)
PC placed to pt today to f/u on our conversation from 1 wk ago.  Patient was supposed to get back to me with the date of his appointment with neurology once he had set up the appointment so that I can complete his FMLA/disability form.  On review of his chart, patient no showed the neurology appointment on the 26th of this month.  Patient tells me he was not aware of the appointment.  However when I looked at the appointment detail, apparently they had spoken to the patient and he, according to their documentation was aware to arrive for his appointment at 12:30.  Patient states that he may have spoken with them but he has a lot going on right now and if they do not send him a text message to remind him of the appointment he probably would forget it.  Advised patient to call [facility] again to set up another appointment.  I think he may be having memory issues so I encouraged him to write down the appointment date once he gets the appointment and set up an alarm on his phone so that he does not forget the appointment in the future.  He said he will do so.  Regards to the FMLA/disability form, I told the patient that I will keep him out until the end of August with a tentative return to work date of September 1.  Hopefully that gives [place] enough time for him to get in with the neurologist.  Patient is agreeable with this plan.  Advised patient of the importance again that he should not drive until he has been seizure-free for at least 6 to 12 months.  Patient states that so far he has not had any further sz.Marland Kitchen

## 2019-09-28 ENCOUNTER — Telehealth: Payer: Self-pay

## 2019-09-28 ENCOUNTER — Telehealth: Payer: Self-pay | Admitting: *Deleted

## 2019-09-28 NOTE — Telephone Encounter (Signed)
Physician with [facility] called with information regarding the patient.  1. The patient has enrolled in a diabetes telephonic system which he receives a call every 2 weeks but does not adhere to goals discussed. Thinks he would benefit further from other diabetes education/communication. 2. Has not followed thru with an eye examination.  3. He is followed by behavioral therapy team assisting him with past substance abuse therapy.  4. On the back of his card is a number for one of the company's resource manager's that may be reached if needed.

## 2019-09-28 NOTE — Telephone Encounter (Signed)
Contacted pt and left a detailed vm informing pt that his FMLA paperwork has been completed but he will need to pay the $10 before the form can be picked up or faxed.

## 2019-09-29 DIAGNOSIS — Z0289 Encounter for other administrative examinations: Secondary | ICD-10-CM

## 2019-10-17 NOTE — Telephone Encounter (Signed)
Contacted pt and left a detailed vm making pt aware that he will need to contact the neurologist office to request a sooner appointment and if he has any questions or concerns to give a call

## 2019-11-01 ENCOUNTER — Telehealth: Payer: Self-pay | Admitting: Internal Medicine

## 2019-11-01 NOTE — Telephone Encounter (Signed)
OK 

## 2019-11-01 NOTE — Telephone Encounter (Signed)
Please refer to responses below

## 2019-11-01 NOTE — Telephone Encounter (Signed)
Patient's work schedule is set to where patient is now only off on Wednesdays - patient is not able to go to [facility] so just asking if he could switch Dr's. Asking if patient could switch to [facility] for transfer of care so he can be seen on a Wednesday?

## 2019-11-01 NOTE — Telephone Encounter (Signed)
Please review pt request and advise 

## 2019-11-03 ENCOUNTER — Ambulatory Visit: Payer: 59 | Admitting: Internal Medicine

## 2019-11-28 ENCOUNTER — Ambulatory Visit: Payer: 59 | Admitting: Neurology

## 2019-12-20 ENCOUNTER — Ambulatory Visit: Payer: 59 | Admitting: Endocrinology

## 2020-01-31 ENCOUNTER — Ambulatory Visit: Payer: 59 | Admitting: Neurology

## 2020-02-07 ENCOUNTER — Ambulatory Visit: Payer: 59 | Admitting: Endocrinology

## 2020-04-24 ENCOUNTER — Telehealth: Payer: Self-pay | Admitting: Internal Medicine

## 2020-04-24 NOTE — Telephone Encounter (Signed)
Received Death Certificate will give to provider first thing the morning

## 2020-04-24 NOTE — Telephone Encounter (Signed)
[place] [employer] dropped off death certificate. Placed in PCP box.

## 2020-04-25 ENCOUNTER — Encounter: Payer: Self-pay | Admitting: Internal Medicine

## 2020-04-25 NOTE — Telephone Encounter (Signed)
Gave provider death certificate this morning

## 2020-04-25 NOTE — Telephone Encounter (Signed)
Provider returned certificate I have called [facility] and made aware that certificate is ready for pickup.. I have made a copy for RN and a copy for pt chart.

## 2020-04-25 NOTE — Progress Notes (Signed)
I received a paper copy of death certificate from [facility] in [place].  They have requested that I do a certification of death.  I spoke with the receptionist, Vernona Rieger, at the funeral home and was able to get patient's ex-wife's number.  Her name is Germaine Shenker and she is handling the funeral arrangements. Pt's date and time of death was May 01, 2020 at 11:44 p.m. According to Ms. Mcquire, patient was found dead by his housemate Fayrene Fearing who was called upon to do a welfare check with police officers on 2020/05/01. Per the ex-wife, Fayrene Fearing found the patient on 04/11/2020 sitting on his steps.  He had a mask on and was having difficulty breathing.  He also noted that the bathroom was in a mess and that the patient apparently was having diarrhea.  He requested that Fayrene Fearing give him some ginger ale.  Other persons reportedly who spoke to him said that he sounded horrible and was having difficulty breathing.  Patient had told them that he was fighting a bad cold.  Someone suggested that he be seen and get tested for Covid.  The last time his housemate spoke with him was 04/13/2020 through his room door. Based on this information, I released his immediate cause of death as acute viral respiratory infection. Secondary cause of death diabetes type 2 with long term insulin use, alcohol use disorder severe and major depression.

## 2020-04-30 DEATH — deceased
